# Patient Record
Sex: Male | Born: 1950 | Race: White | Hispanic: No | Marital: Married | Smoking: Former smoker
Health system: Southern US, Community
[De-identification: ages and names within clinical notes are randomized; demographics above are authoritative.]

## PROBLEM LIST (undated history)

## (undated) DIAGNOSIS — E7800 Pure hypercholesterolemia, unspecified: Secondary | ICD-10-CM

## (undated) DIAGNOSIS — H6690 Otitis media, unspecified, unspecified ear: Secondary | ICD-10-CM

## (undated) DIAGNOSIS — K219 Gastro-esophageal reflux disease without esophagitis: Secondary | ICD-10-CM

## (undated) DIAGNOSIS — C44301 Unspecified malignant neoplasm of skin of nose: Secondary | ICD-10-CM

## (undated) DIAGNOSIS — M5416 Radiculopathy, lumbar region: Secondary | ICD-10-CM

## (undated) DIAGNOSIS — R51 Headache: Secondary | ICD-10-CM

## (undated) DIAGNOSIS — J069 Acute upper respiratory infection, unspecified: Secondary | ICD-10-CM

## (undated) DIAGNOSIS — G8929 Other chronic pain: Secondary | ICD-10-CM

## (undated) DIAGNOSIS — Z87442 Personal history of urinary calculi: Secondary | ICD-10-CM

## (undated) HISTORY — DX: Radiculopathy, lumbar region: M54.16

## (undated) HISTORY — DX: Unspecified malignant neoplasm of skin of nose: C44.301

## (undated) HISTORY — DX: Gastro-esophageal reflux disease without esophagitis: K21.9

## (undated) HISTORY — DX: Otitis media, unspecified, unspecified ear: H66.90

## (undated) HISTORY — DX: Pure hypercholesterolemia, unspecified: E78.00

## (undated) HISTORY — DX: Headache: R51

## (undated) HISTORY — PX: VASECTOMY: SHX75

## (undated) HISTORY — DX: Acute upper respiratory infection, unspecified: J06.9

## (undated) HISTORY — PX: BACK SURGERY: SHX140

## (undated) HISTORY — PX: SKIN CANCER EXCISION: SHX779

## (undated) HISTORY — DX: Other chronic pain: G89.29

---

## 2006-10-07 ENCOUNTER — Emergency Department: Payer: Self-pay | Admitting: Emergency Medicine

## 2007-01-31 ENCOUNTER — Encounter: Admission: RE | Admit: 2007-01-31 | Discharge: 2007-01-31 | Payer: Self-pay | Admitting: Neurosurgery

## 2008-06-18 ENCOUNTER — Encounter: Admission: RE | Admit: 2008-06-18 | Discharge: 2008-06-18 | Payer: Self-pay | Admitting: Neurosurgery

## 2008-07-02 ENCOUNTER — Encounter: Admission: RE | Admit: 2008-07-02 | Discharge: 2008-07-02 | Payer: Self-pay | Admitting: Neurosurgery

## 2008-07-16 ENCOUNTER — Encounter: Admission: RE | Admit: 2008-07-16 | Discharge: 2008-07-16 | Payer: Self-pay | Admitting: Neurosurgery

## 2008-08-06 ENCOUNTER — Encounter: Admission: RE | Admit: 2008-08-06 | Discharge: 2008-08-06 | Payer: Self-pay | Admitting: Neurosurgery

## 2008-08-13 ENCOUNTER — Ambulatory Visit (HOSPITAL_COMMUNITY): Admission: RE | Admit: 2008-08-13 | Discharge: 2008-08-14 | Payer: Self-pay | Admitting: Neurosurgery

## 2009-02-18 ENCOUNTER — Encounter: Admission: RE | Admit: 2009-02-18 | Discharge: 2009-02-18 | Payer: Self-pay | Admitting: Neurosurgery

## 2009-08-11 ENCOUNTER — Ambulatory Visit (HOSPITAL_COMMUNITY): Admission: RE | Admit: 2009-08-11 | Discharge: 2009-08-12 | Payer: Self-pay | Admitting: Neurosurgery

## 2009-09-12 IMAGING — CR DG LUMBAR SPINE 2-3V
1 series · 1 of 1 positions shown · non-contrast
Comparison: 01/31/2007

CLINICAL DATA: L2-3 and L3-4 laminectomy.

LUMBAR SPINE - 2-3 VIEW

[view not recorded]
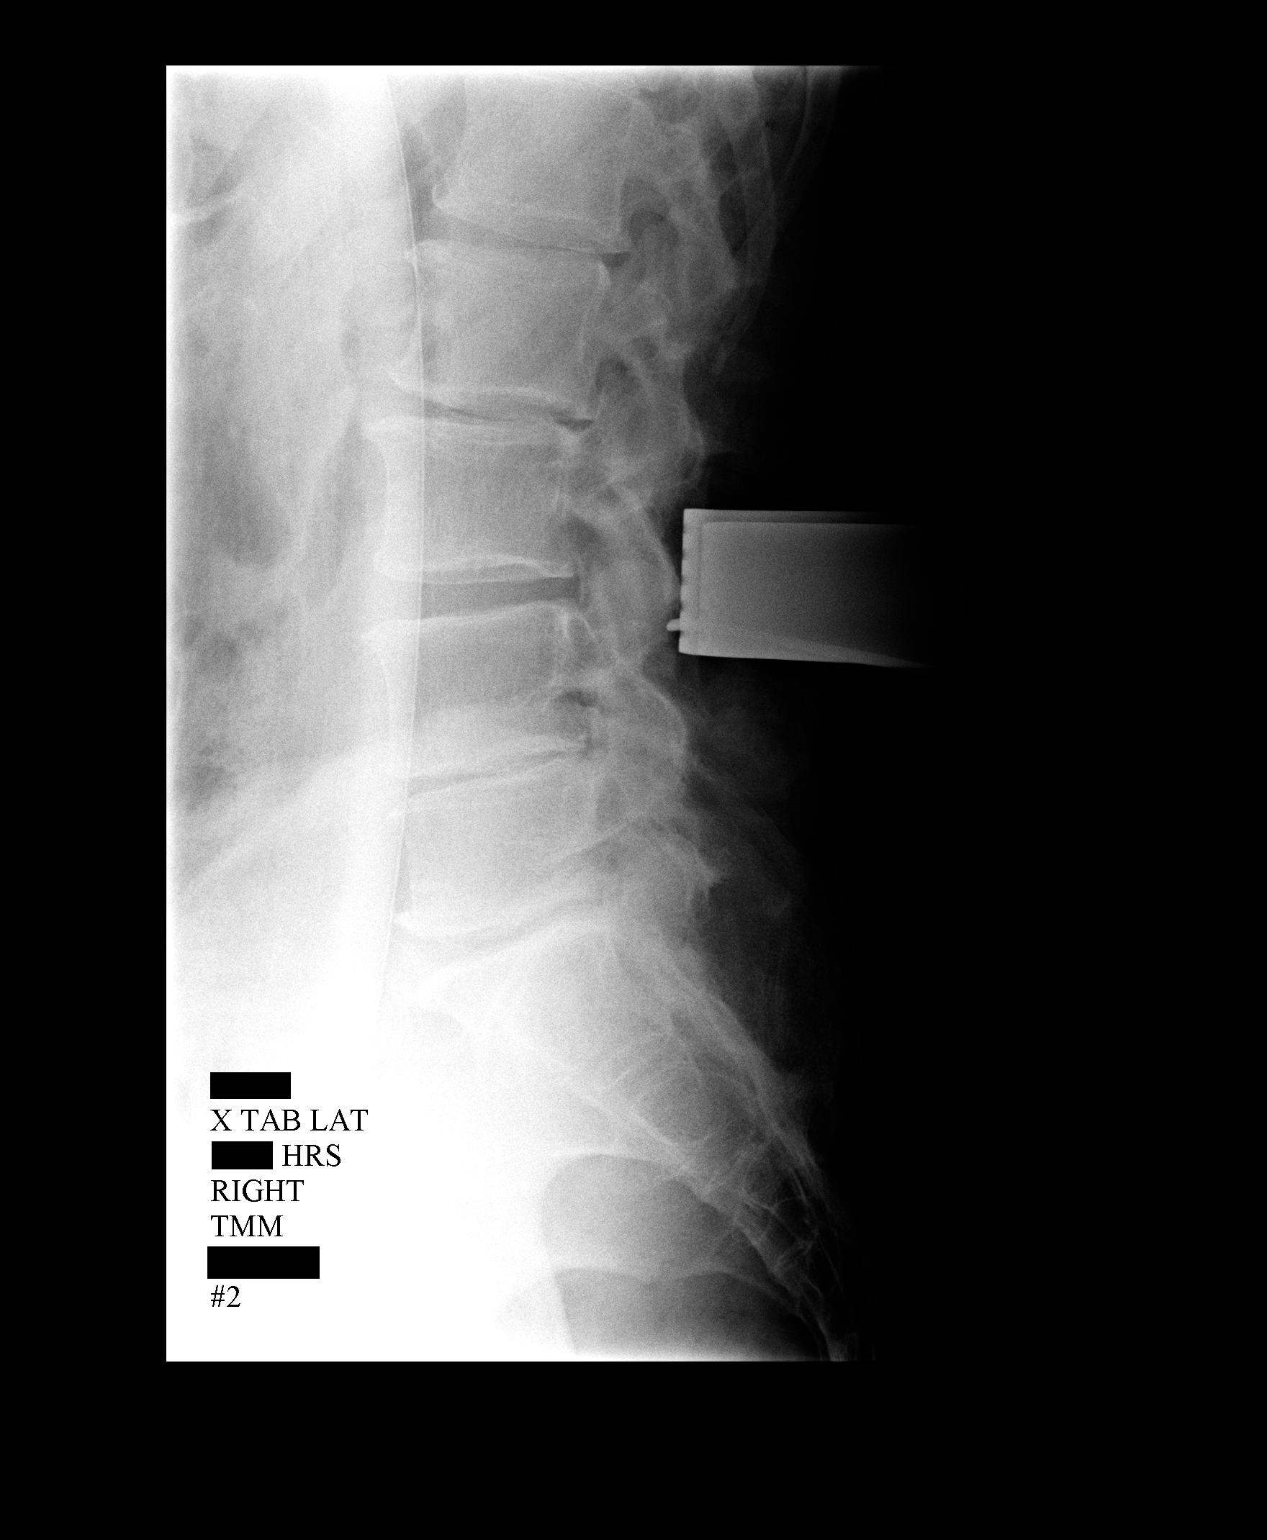

[1 of 1 positions shown; findings below may reference images not displayed]

FINDINGS: Intraoperative lateral views of the lumbar spine
submitted for review at surgery.  The first film reveals metallic
probe at the L4 level.  The second film reveals metallic probe
directed to the L3-4 level.
IMPRESSION: Localization L3-4.

## 2010-09-10 IMAGING — CR DG LUMBAR SPINE 2-3V
1 series · 1 of 1 positions shown · non-contrast
Comparison: CT lumbar myelogram 02/18/2009.

CLINICAL DATA: 57-year-old male undergoing lumbar surgery.

LUMBAR SPINE - 2-3 VIEW

[view not recorded]
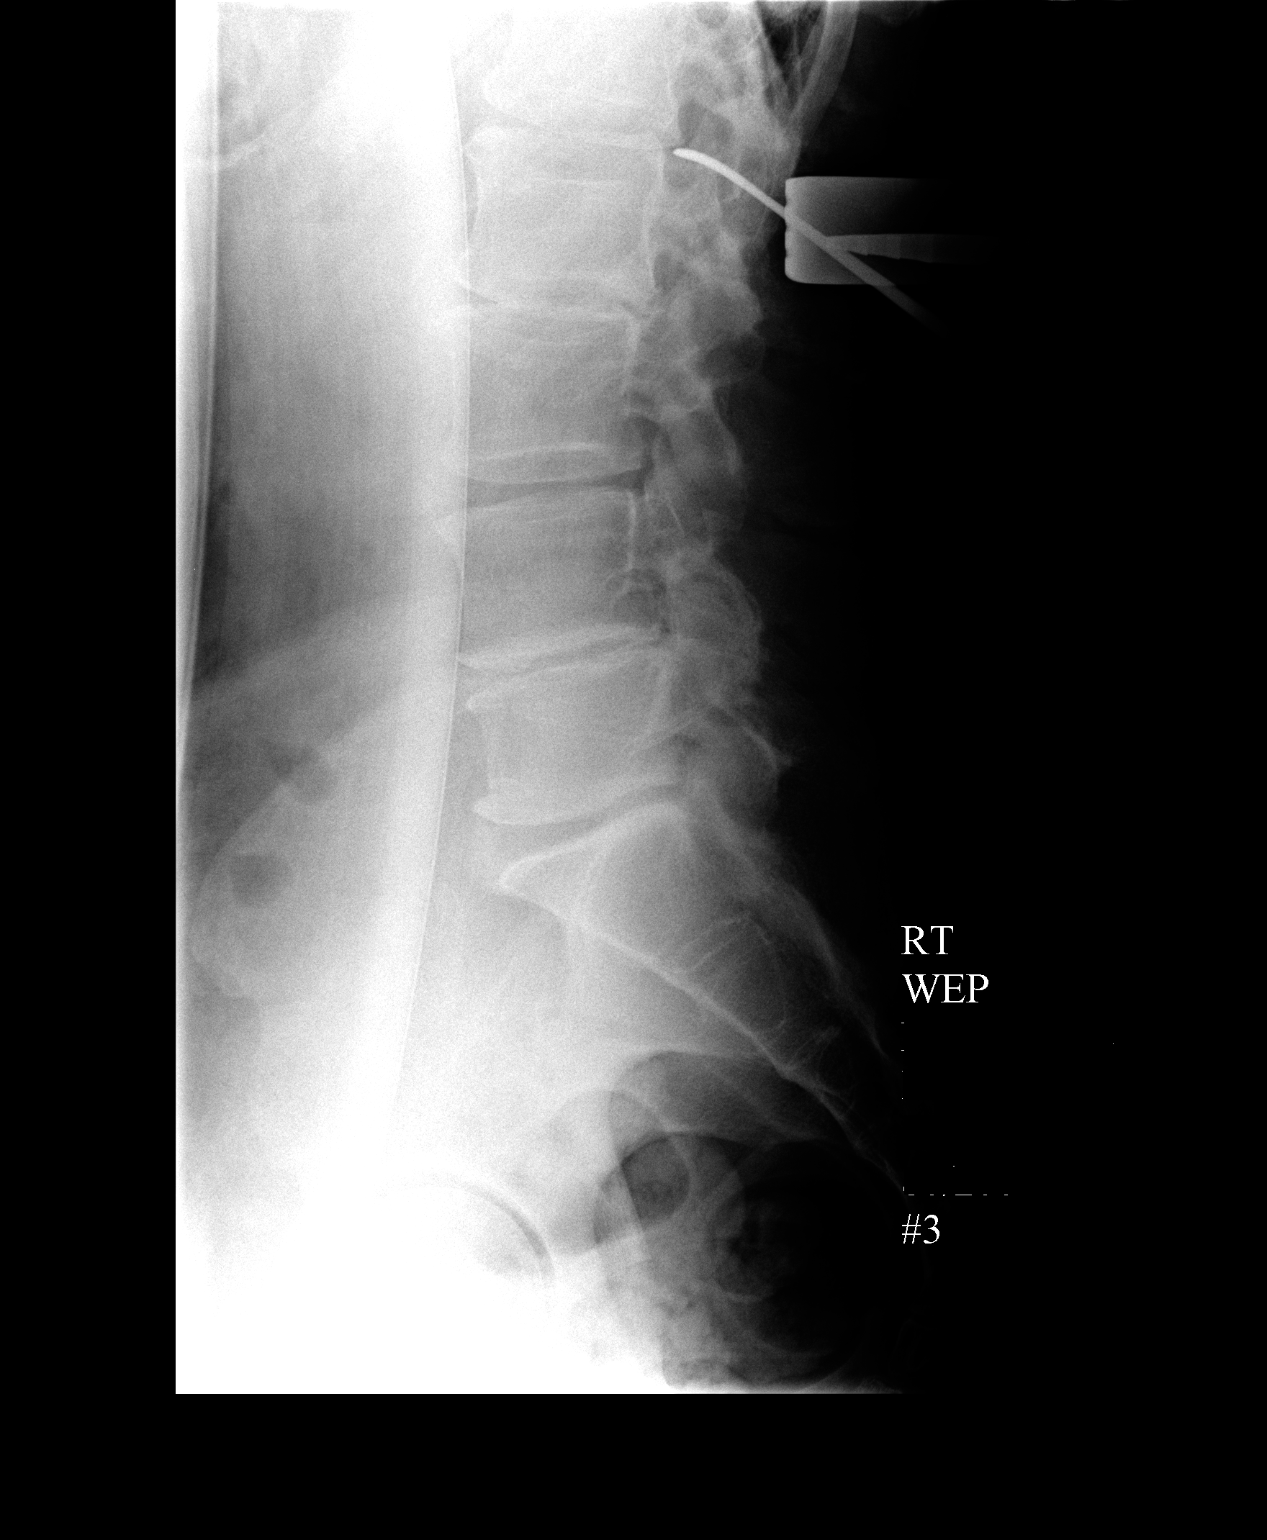

[1 of 1 positions shown; findings below may reference images not displayed]

FINDINGS: Multiple portable cross-table lateral intraoperative view
of the lumbar spine labeled

On image #1 at 5883 hours a needle is directed at the L2 vertebral
body, the level of the L2 narrowing the foramen.

On image #2 4169 hours there is a surgical probe directed at the L2
pedicle level.

On image #3 at 8596 hours surgical probe is directed at the L1-L2
disc space.
IMPRESSION: Intraoperative localization as above.

## 2010-10-22 ENCOUNTER — Encounter: Payer: Self-pay | Admitting: Neurosurgery

## 2011-01-03 LAB — CBC
Hemoglobin: 14.2 g/dL (ref 13.0–17.0)
MCHC: 35 g/dL (ref 30.0–36.0)
MCV: 93.7 fL (ref 78.0–100.0)
RBC: 4.34 MIL/uL (ref 4.22–5.81)
WBC: 6.1 10*3/uL (ref 4.0–10.5)

## 2011-02-13 NOTE — Op Note (Signed)
NAME:  Jacob [name], Jacob [name] NO.:  [id number]   MEDICAL RECORD NO.:  [id number]          PATIENT TYPE:  INP   LOCATION:  2899                         FACILITY:  [facility]   PHYSICIAN:  Reinaldo Meeker, M.D. 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   PROCEDURE:  Bilateral L2-L3 and L3-L4 interlaminar laminotomies for  decompression of L3 and L4 nerve roots bilaterally.   SURGEON:  Reinaldo Meeker, MD   ASSISTANT:  Tia Alert, MD   PROCEDURE IN DETAIL:  After being placed in the prone position, the  patient's back was prepped and draped in the usual sterile fashion.  Localizing x-rays were taken prior to incision to identify the  appropriate level.  Midline incision was made about the spinous  processes of L2, L3, and L4.  Using the Bovie and cutting current, the  incision was carried down to the spinous processes.  Subperiosteal  dissection was then carried out bilaterally on the spinous processes and  lamina and facet joints.  A self-retaining retractor was placed for  exposure.  X-rays showed approach to be at the appropriate levels.  Starting at L3-L4 on the left, laminotomy was performed by removing the  inferior one-half of the L3 lamina, the medial one-third of the facet  joint, and superior one-third of the L4 lamina.  Residual bone and  ligamentum flavum were removed in a piecemeal fashion.  L4 nerve root  was identified, tracked out its foramen, and decompressed thoroughly.  Attention was then turned to L2-L3 where a similar procedure was carried  out.  Once again, the inferior one-half of L2 lamina, medial one-third  of facet joint, and the superior one-third of L3 lamina were  removed.  Once again, residual bone and ligamentum flavum were removed in  piecemeal fashion until thecal sac in L3 nerve root was identified and  well decompressed at its foramen.  Disk at L2-L3 and L3-L4 on the left  was then examined and not felt to be herniated.  At this time, similar  laminotomies were performed on the left.  Starting at the L2-L3, the  inferior one-half of the L2 lamina, the medial one-third of the facet  joint, and the superior one-third of the L3 lamina were removed.  Once  again, residual bone and ligamentum flavum were removed in a piecemeal  fashion.  At L3-L4, the superior one-half of L3 lamina, the medial one-  third of facet joint, and superior one-third of the L4 lamina were  removed.  Once again, residual bone and ligamentum flavum were removed  in a piecemeal fashion until the L4 nerve root was identified and well  decompressed at its foramen.  At this time, disk spaces at L2-L3 and L3-  L4 on the right were identified and felt not to be herniated and not in  need  of any incision or removal.  At this time, the wound was inspected  once more for any evidence of residual compression and none could be  identified.  Thecal sac was well decompressed bilaterally as were the L3  and L4 nerve roots.  Large amounts of irrigation were carried at this  time and any bleeding was controlled bipolar coagulation and Gelfoam.  The wound  was then closed with interrupted Vicryl in the muscle fascia,  subcutaneous and subcuticular tissues, and staples were placed on the  skin.  Sterile dressing was then applied and the patient was extubated  and taken to the recovery room in stable condition.           ______________________________  Reinaldo Meeker, M.D.     ROK/MEDQ  D:  08/13/2008  T:  08/14/2008  Job:  [id number]

## 2011-07-03 LAB — CBC
HCT: 45.8
Hemoglobin: 15.4
MCV: 94.7
RBC: 4.83
RDW: 13.9

## 2013-05-13 ENCOUNTER — Encounter (HOSPITAL_COMMUNITY): Payer: Self-pay | Admitting: Pharmacy Technician

## 2013-05-13 NOTE — Consult Note (Signed)
NAMELITTLETON, HAUB NO.:  [id number]  MEDICAL RECORD NO.:  [id number]  LOCATION:                                 FACILITY:  PHYSICIAN:  Barbaraann Barthel, M.D. DATE OF BIRTH:  13-Oct-1950  DATE OF CONSULTATION:  05/12/2013 DATE OF DISCHARGE:                                CONSULTATION   DIAGNOSIS:  Right inguinal hernia.  NOTE:  This is a 62 year old white male who we saw earlier in July after referral for an elective repair of a right inguinal hernia.  His family was just about to experience the birth of a grandchild and so surgery was postponed as this was an elective case.  We then saw him in the office on May 12, 2013, at which time we planned to proceed with the surgery.  HISTORY OF PRESENT MEDICAL ILLNESS:  The patient has had the right inguinal hernia at least for 2 or 3 months.  PAST MEDICAL HISTORY:  The patient has had mainly chronic back pain which has required 3 previous surgeries, the last surgery being in 2011. This was complicated with a right footdrop.  The other medical problems include besides his lumbar radiculopathy from his back pain, he has problems with GERD, history of hypercholesterolemia, and he has had a previous surgical excision of a skin cancer on the tip of his nose.  PAST SURGICAL HISTORY:  Three back surgeries, skin cancer surgery on the nose.  MEDICATIONS:  See medication list.  In essence, he is taking right now simply codeine medications for his chronic back pain and he has now been started on a cholesterol medicine.  ALLERGIES:  He has no known allergies.  SOCIAL HISTORY:  He no longer smokes.  He used to smoke, but he has not smoked essentially since 1985.  He does not abuse tobacco or recreational drugs.  PHYSICAL EXAMINATION:  GENERAL:  He is in no acute distress.  He is 5 feet 10 inches tall.  Weighs 185 pounds. VITAL SIGNS:  Temperature is 98.0, pulse is regular, 56 per minute, respirations 14, blood  pressure 110/64. HEENT:  Head is normocephalic.  Eyes, extraocular movements are intact. The patient has some problem with the loss of vision in his right eye. As stated, he has a skin graft that is well healed for cancer of the skin with no obvious recurrence noted. NECK:  He has no bruits appreciated.  No adenopathy.  No thyromegaly. CHEST:  Clear both to anterior and posterior auscultation. HEART:  Regular rhythm. ABDOMEN:  Soft.  The patient has a right inguinal hernia which is easily reducible.  This is moderate in size.  There is no visceromegaly or masses palpated. RECTAL:  By the patient's request, deferred. EXTREMITIES:  He has a right footdrop.  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  REVIEW OF SYSTEMS:  NEUROLOGIC:  No history of migraines, no history of seizures.  He has a footdrop from previous back surgeries as a sequela. ENDOCRINE:  No history of diabetes or thyroid disease or adrenal problems.  CARDIOPULMONARY:  Hypercholesterolemia.  MUSCULOSKELETAL:  As stated, back problems and radiculopathy with right  footdrop.  GI:  No history of hepatitis, constipation, diarrhea, bright red rectal bleeding, melena, inflammatory bowel disease, irritable bowel syndrome, or history of unexplained weight loss.  He underwent a colonoscopy with polypectomy in 2010.  GU:  No history of frequency or dysuria.  He has a past history of kidney stones.  REVIEW OF HISTORY OF PHYSICAL:  Therefore, Mr. Olgin is a 62 year old white male who we will plan to do an elective right inguinal herniorrhaphy.  I discussed complications with him.  Complications not limited to, but including bleeding, infection, and recurrence, and the possibility of mesh prosthesis might be required.  Informed consent was obtained.     Barbaraann Barthel, M.D.     WB/MEDQ  D:  05/12/2013  T:  05/13/2013  Job:  [id number]  cc:   Theressa Millard, M.D. Fax: [phone]

## 2013-05-14 ENCOUNTER — Encounter (HOSPITAL_COMMUNITY)
Admission: RE | Admit: 2013-05-14 | Discharge: 2013-05-14 | Disposition: A | Discharge status: Home / Self Care | Payer: BC Managed Care – PPO | Source: Ambulatory Visit | Attending: General Surgery | Admitting: General Surgery

## 2013-05-14 ENCOUNTER — Encounter (HOSPITAL_COMMUNITY): Payer: Self-pay

## 2013-05-14 ENCOUNTER — Other Ambulatory Visit: Payer: Self-pay

## 2013-05-14 HISTORY — DX: Pure hypercholesterolemia, unspecified: E78.00

## 2013-05-14 LAB — CBC WITH DIFFERENTIAL/PLATELET
Basophils Absolute: 0.1 10*3/uL (ref 0.0–0.1)
Lymphocytes Relative: 31 % (ref 12–46)
MCV: 91.5 fL (ref 78.0–100.0)
Monocytes Absolute: 0.6 10*3/uL (ref 0.1–1.0)
Neutrophils Relative %: 46 % (ref 43–77)
Platelets: 210 10*3/uL (ref 150–400)
RBC: 4.26 MIL/uL (ref 4.22–5.81)
RDW: 12.9 % (ref 11.5–15.5)

## 2013-05-14 LAB — BASIC METABOLIC PANEL
Calcium: 9.5 mg/dL (ref 8.4–10.5)
Chloride: 102 mEq/L (ref 96–112)
Creatinine, Ser: 0.96 mg/dL (ref 0.50–1.35)
GFR calc non Af Amer: 89 mL/min — ABNORMAL LOW (ref >90)
Sodium: 140 mEq/L (ref 135–145)

## 2013-05-14 NOTE — Patient Instructions (Addendum)
Jacob [name]  05/14/2013   Your procedure is scheduled on:  05/18/2013  Report to [facility] at  1000  AM.  Call this number if you have problems the morning of surgery: [phone]   Remember:   Do not eat food or drink liquids after midnight.   Take these medicines the morning of surgery with A SIP OF WATER: pain pill   Do not wear jewelry, make-up or nail polish.  Do not wear lotions, powders, or perfumes.   Do not shave 48 hours prior to surgery. Men may shave face and neck.  Do not bring valuables to the hospital.  [facility] is not responsible  for any belongings or valuables.  Contacts, dentures or bridgework may not be worn into surgery.  Leave suitcase in the car. After surgery it may be brought to your room.  For patients admitted to the hospital, checkout time is 11:00 AM the day of discharge.   Patients discharged the day of surgery will not be allowed to drive home.  Name and phone number of your driver: family  Special Instructions: Shower using CHG 2 nights before surgery and the night before surgery.  If you shower the day of surgery use CHG.  Use special wash - you have one bottle of CHG for all showers.  You should use approximately 1/3 of the bottle for each shower.   Please read over the following fact sheets that you were given: Pain Booklet, Coughing and Deep Breathing, MRSA Information, Surgical Site Infection Prevention, Anesthesia Post-op Instructions and Care and Recovery After Surgery Inguinal Hernia, Adult Muscles help keep everything in the body in its proper place. But if a weak spot in the muscles develops, something can poke through. That is called a hernia. When this happens in the lower part of the belly (abdomen), it is called an inguinal hernia. (It takes its name from a part of the body in this region called the inguinal canal.) A weak spot in the wall of muscles lets some fat or part of the small intestine bulge through. An inguinal hernia can  develop at any age. Men get them more often than women. CAUSES  In adults, an inguinal hernia develops over time.  It can be triggered by:  Suddenly straining the muscles of the lower abdomen.  Lifting heavy objects.  Straining to have a bowel movement. Difficult bowel movements (constipation) can lead to this.  Constant coughing. This may be caused by smoking or lung disease.  Being overweight.  Being pregnant.  Working at a job that requires long periods of standing or heavy lifting.  Having had an inguinal hernia before. One type can be an emergency situation. It is called a strangulated inguinal hernia. It develops if part of the small intestine slips through the weak spot and cannot get back into the abdomen. The blood supply can be cut off. If that happens, part of the intestine may die. This situation requires emergency surgery. SYMPTOMS  Often, a small inguinal hernia has no symptoms. It is found when a healthcare provider does a physical exam. Larger hernias usually have symptoms.   In adults, symptoms may include:  A lump in the groin. This is easier to see when the person is standing. It might disappear when lying down.  In men, a lump in the scrotum.  Pain or burning in the groin. This occurs especially when lifting, straining or coughing.  A dull ache or feeling of  pressure in the groin.  Signs of a strangulated hernia can include:  A bulge in the groin that becomes very painful and tender to the touch.  A bulge that turns red or purple.  Fever, nausea and vomiting.  Inability to have a bowel movement or to pass gas. DIAGNOSIS  To decide if you have an inguinal hernia, a healthcare provider will probably do a physical examination.  This will include asking questions about any symptoms you have noticed.  The healthcare provider might feel the groin area and ask you to cough. If an inguinal hernia is felt, the healthcare provider may try to slide it back  into the abdomen.  Usually no other tests are needed. TREATMENT  Treatments can vary. The size of the hernia makes a difference. Options include:  Watchful waiting. This is often suggested if the hernia is small and you have had no symptoms.  No medical procedure will be done unless symptoms develop.  You will need to watch closely for symptoms. If any occur, contact your healthcare provider right away.  Surgery. This is used if the hernia is larger or you have symptoms.  Open surgery. This is usually an outpatient procedure (you will not stay overnight in a hospital). An cut (incision) is made through the skin in the groin. The hernia is put back inside the abdomen. The weak area in the muscles is then repaired by herniorrhaphy or hernioplasty. Herniorrhaphy: in this type of surgery, the weak muscles are sewn back together. Hernioplasty: a patch or mesh is used to close the weak area in the abdominal wall.  Laparoscopy. In this procedure, a surgeon makes small incisions. A thin tube with a tiny video camera (called a laparoscope) is put into the abdomen. The surgeon repairs the hernia with mesh by looking with the video camera and using two long instruments. HOME CARE INSTRUCTIONS   After surgery to repair an inguinal hernia:  You will need to take pain medicine prescribed by your healthcare provider. Follow all directions carefully.  You will need to take care of the wound from the incision.  Your activity will be restricted for awhile. This will probably include no heavy lifting for several weeks. You also should not do anything too active for a few weeks. When you can return to work will depend on the type of job that you have.  During "watchful waiting" periods, you should:  Maintain a healthy weight.  Eat a diet high in fiber (fruits, vegetables and whole grains).  Drink plenty of fluids to avoid constipation. This means drinking enough water and other liquids to keep your  urine clear or pale yellow.  Do not lift heavy objects.  Do not stand for long periods of time.  Quit smoking. This should keep you from developing a frequent cough. SEEK MEDICAL CARE IF:   A bulge develops in your groin area.  You feel pain, a burning sensation or pressure in the groin. This might be worse if you are lifting or straining.  You develop a fever of more than 100.5 F (38.1 C). SEEK IMMEDIATE MEDICAL CARE IF:   Pain in the groin increases suddenly.  A bulge in the groin gets bigger suddenly and does not go down.  For men, there is sudden pain in the scrotum. Or, the size of the scrotum increases.  A bulge in the groin area becomes red or purple and is painful to touch.  You have nausea or vomiting that does not go  away.  You feel your heart beating much faster than normal.  You cannot have a bowel movement or pass gas.  You develop a fever of more than 102.0 F (38.9 C). Document Released: 02/03/2009 Document Revised: 12/10/2011 Document Reviewed: 02/03/2009 [facility] Patient Information 2014 [place], [place]. PATIENT INSTRUCTIONS POST-ANESTHESIA  IMMEDIATELY FOLLOWING SURGERY:  Do not drive or operate machinery for the first twenty four hours after surgery.  Do not make any important decisions for twenty four hours after surgery or while taking narcotic pain medications or sedatives.  If you develop intractable nausea and vomiting or a severe headache please notify your doctor immediately.  FOLLOW-UP:  Please make an appointment with your surgeon as instructed. You do not need to follow up with anesthesia unless specifically instructed to do so.  WOUND CARE INSTRUCTIONS (if applicable):  Keep a dry clean dressing on the anesthesia/puncture wound site if there is drainage.  Once the wound has quit draining you may leave it open to air.  Generally you should leave the bandage intact for twenty four hours unless there is drainage.  If the epidural site drains for  more than 36-48 hours please call the anesthesia department.  QUESTIONS?:  Please feel free to call your physician or the hospital operator if you have any questions, and they will be happy to assist you.

## 2013-05-18 ENCOUNTER — Encounter (HOSPITAL_COMMUNITY)
Admission: RE | Disposition: A | Discharge status: Home / Self Care | Payer: Self-pay | Source: Ambulatory Visit | Attending: General Surgery

## 2013-05-18 ENCOUNTER — Encounter (HOSPITAL_COMMUNITY): Payer: Self-pay

## 2013-05-18 ENCOUNTER — Ambulatory Visit (HOSPITAL_COMMUNITY)
Admission: RE | Admit: 2013-05-18 | Discharge: 2013-05-18 | Disposition: A | Discharge status: Home / Self Care | Payer: BC Managed Care – PPO | Source: Ambulatory Visit | Attending: General Surgery | Admitting: General Surgery

## 2013-05-18 ENCOUNTER — Encounter (HOSPITAL_COMMUNITY): Payer: Self-pay | Admitting: Anesthesiology

## 2013-05-18 ENCOUNTER — Ambulatory Visit (HOSPITAL_COMMUNITY): Payer: BC Managed Care – PPO | Admitting: Anesthesiology

## 2013-05-18 DIAGNOSIS — K4090 Unilateral inguinal hernia, without obstruction or gangrene, not specified as recurrent: Secondary | ICD-10-CM | POA: Insufficient documentation

## 2013-05-18 DIAGNOSIS — Z01812 Encounter for preprocedural laboratory examination: Secondary | ICD-10-CM | POA: Insufficient documentation

## 2013-05-18 HISTORY — PX: INGUINAL HERNIA REPAIR: SHX194

## 2013-05-18 SURGERY — REPAIR, HERNIA, INGUINAL, ADULT
Anesthesia: General | Site: Abdomen | Laterality: Right | Wound class: Clean

## 2013-05-18 MED ORDER — DOCUSATE SODIUM 100 MG PO CAPS
100.0000 mg | ORAL_CAPSULE | Freq: Every day | ORAL | Status: DC
  Administered 2013-05-18: 100 mg via ORAL
  Filled 2013-05-18: qty 1

## 2013-05-18 MED ORDER — SODIUM CHLORIDE 0.9 % IR SOLN
Status: DC | PRN
  Administered 2013-05-18: 1000 mL

## 2013-05-18 MED ORDER — ONDANSETRON HCL 4 MG/2ML IJ SOLN
4.0000 mg | Freq: Once | INTRAMUSCULAR | Status: AC
  Administered 2013-05-18: 4 mg via INTRAVENOUS

## 2013-05-18 MED ORDER — ONDANSETRON HCL 4 MG PO TABS: 4.0000 mg | ORAL_TABLET | Freq: Four times a day (QID) | ORAL | Status: DC | PRN

## 2013-05-18 MED ORDER — FENTANYL CITRATE 0.05 MG/ML IJ SOLN
INTRAMUSCULAR | Status: AC
  Filled 2013-05-18: qty 5

## 2013-05-18 MED ORDER — PROPOFOL 10 MG/ML IV BOLUS
INTRAVENOUS | Status: DC | PRN
  Administered 2013-05-18: 160 mg via INTRAVENOUS

## 2013-05-18 MED ORDER — ONDANSETRON HCL 4 MG/2ML IJ SOLN: 4.0000 mg | Freq: Once | INTRAMUSCULAR | Status: DC | PRN

## 2013-05-18 MED ORDER — CEFAZOLIN SODIUM-DEXTROSE 2-3 GM-% IV SOLR
2.0000 g | Freq: Once | INTRAVENOUS | Status: AC
  Administered 2013-05-18: 2 g via INTRAVENOUS

## 2013-05-18 MED ORDER — CEFAZOLIN SODIUM 1 G IJ SOLR
INTRAMUSCULAR | Status: AC
  Filled 2013-05-18: qty 20

## 2013-05-18 MED ORDER — OXYCODONE HCL ER 20 MG PO T12A: 20.0000 mg | EXTENDED_RELEASE_TABLET | Freq: Two times a day (BID) | ORAL | Status: DC

## 2013-05-18 MED ORDER — POTASSIUM CHLORIDE IN NACL 20-0.9 MEQ/L-% IV SOLN
INTRAVENOUS | Status: DC
  Administered 2013-05-18: 15:00:00 via INTRAVENOUS

## 2013-05-18 MED ORDER — FENTANYL CITRATE 0.05 MG/ML IJ SOLN
INTRAMUSCULAR | Status: AC
  Filled 2013-05-18: qty 2

## 2013-05-18 MED ORDER — FENTANYL CITRATE 0.05 MG/ML IJ SOLN
25.0000 ug | INTRAMUSCULAR | Status: DC | PRN
  Administered 2013-05-18: 50 ug via INTRAVENOUS

## 2013-05-18 MED ORDER — STERILE WATER FOR IRRIGATION IR SOLN
Status: DC | PRN
  Administered 2013-05-18 (×2): 1000 mL

## 2013-05-18 MED ORDER — FENTANYL CITRATE 0.05 MG/ML IJ SOLN
INTRAMUSCULAR | Status: DC | PRN
  Administered 2013-05-18 (×5): 25 ug via INTRAVENOUS
  Administered 2013-05-18 (×2): 50 ug via INTRAVENOUS

## 2013-05-18 MED ORDER — FENTANYL CITRATE 0.05 MG/ML IJ SOLN: 25.0000 ug | INTRAMUSCULAR | Status: DC | PRN

## 2013-05-18 MED ORDER — MIDAZOLAM HCL 2 MG/2ML IJ SOLN
INTRAMUSCULAR | Status: AC
  Filled 2013-05-18: qty 2

## 2013-05-18 MED ORDER — BUPIVACAINE HCL (PF) 0.5 % IJ SOLN
INTRAMUSCULAR | Status: DC | PRN
  Administered 2013-05-18: 5 mL
  Administered 2013-05-18: 8 mL

## 2013-05-18 MED ORDER — ATORVASTATIN CALCIUM 20 MG PO TABS: 40.0000 mg | ORAL_TABLET | Freq: Every day | ORAL | Status: DC

## 2013-05-18 MED ORDER — ONDANSETRON HCL 4 MG/2ML IJ SOLN
INTRAMUSCULAR | Status: AC
  Filled 2013-05-18: qty 2

## 2013-05-18 MED ORDER — LACTATED RINGERS IV SOLN
INTRAVENOUS | Status: DC
  Administered 2013-05-18 (×2): via INTRAVENOUS

## 2013-05-18 MED ORDER — FENTANYL CITRATE 0.05 MG/ML IJ SOLN
25.0000 ug | INTRAMUSCULAR | Status: AC
  Administered 2013-05-18 (×2): 25 ug via INTRAVENOUS

## 2013-05-18 MED ORDER — DSS 100 MG PO CAPS: 100.0000 mg | ORAL_CAPSULE | Freq: Every day | ORAL | Status: DC

## 2013-05-18 MED ORDER — PNEUMOCOCCAL VAC POLYVALENT 25 MCG/0.5ML IJ INJ
0.5000 mL | INJECTION | INTRAMUSCULAR | Status: DC
  Filled 2013-05-18: qty 0.5

## 2013-05-18 MED ORDER — MIDAZOLAM HCL 2 MG/2ML IJ SOLN
1.0000 mg | INTRAMUSCULAR | Status: DC | PRN
  Administered 2013-05-18 (×2): 2 mg via INTRAVENOUS

## 2013-05-18 MED ORDER — BUPIVACAINE HCL (PF) 0.5 % IJ SOLN
INTRAMUSCULAR | Status: AC
  Filled 2013-05-18: qty 30

## 2013-05-18 MED ORDER — CEFAZOLIN SODIUM-DEXTROSE 2-3 GM-% IV SOLR
INTRAVENOUS | Status: AC
  Filled 2013-05-18: qty 50

## 2013-05-18 MED ORDER — LIDOCAINE HCL (CARDIAC) 20 MG/ML IV SOLN
INTRAVENOUS | Status: DC | PRN
  Administered 2013-05-18: 30 mg via INTRAVENOUS

## 2013-05-18 MED ORDER — PROPOFOL 10 MG/ML IV EMUL
INTRAVENOUS | Status: AC
  Filled 2013-05-18: qty 20

## 2013-05-18 MED ORDER — MORPHINE SULFATE 2 MG/ML IJ SOLN
1.0000 mg | INTRAMUSCULAR | Status: DC | PRN
  Administered 2013-05-18: 1 mg via INTRAVENOUS
  Filled 2013-05-18: qty 1

## 2013-05-18 MED ORDER — ONDANSETRON HCL 4 MG/2ML IJ SOLN: 4.0000 mg | Freq: Four times a day (QID) | INTRAMUSCULAR | Status: DC | PRN

## 2013-05-18 MED ORDER — LIDOCAINE HCL (PF) 1 % IJ SOLN
INTRAMUSCULAR | Status: AC
  Filled 2013-05-18: qty 5

## 2013-05-18 SURGICAL SUPPLY — 48 items
ATTRACTOMAT 16X20 MAGNETIC DRP (DRAPES) ×2 IMPLANT
BAG HAMPER (MISCELLANEOUS) ×2 IMPLANT
CLOTH BEACON ORANGE TIMEOUT ST (SAFETY) ×2 IMPLANT
COVER LIGHT HANDLE STERIS (MISCELLANEOUS) ×4 IMPLANT
DECANTER SPIKE VIAL GLASS SM (MISCELLANEOUS) ×2 IMPLANT
DRAIN PENROSE 12X.25 LTX STRL (MISCELLANEOUS) ×2 IMPLANT
DRSG MEPILEX BORDER 4X8 (GAUZE/BANDAGES/DRESSINGS) IMPLANT
DRSG TEGADERM 4X4.75 (GAUZE/BANDAGES/DRESSINGS) ×2 IMPLANT
ELECT REM PT RETURN 9FT ADLT (ELECTROSURGICAL) ×2
ELECTRODE REM PT RTRN 9FT ADLT (ELECTROSURGICAL) ×1 IMPLANT
FORMALIN 10 PREFIL 120ML (MISCELLANEOUS) ×2 IMPLANT
GLOVE BIOGEL PI IND STRL 6.5 (GLOVE) IMPLANT
GLOVE BIOGEL PI IND STRL 7.0 (GLOVE) IMPLANT
GLOVE BIOGEL PI INDICATOR 6.5 (GLOVE) ×1
GLOVE BIOGEL PI INDICATOR 7.0 (GLOVE) ×2
GLOVE ECLIPSE 6.5 STRL STRAW (GLOVE) ×1 IMPLANT
GLOVE EXAM NITRILE MD LF STRL (GLOVE) ×1 IMPLANT
GLOVE SKINSENSE NS SZ7.0 (GLOVE) ×1
GLOVE SKINSENSE STRL SZ7.0 (GLOVE) ×1 IMPLANT
GLOVE SS BIOGEL STRL SZ 6.5 (GLOVE) IMPLANT
GLOVE SUPERSENSE BIOGEL SZ 6.5 (GLOVE) ×1
GOWN STRL REIN XL XLG (GOWN DISPOSABLE) ×6 IMPLANT
INST SET MINOR GENERAL (KITS) ×2 IMPLANT
KIT ROOM TURNOVER APOR (KITS) ×2 IMPLANT
MANIFOLD NEPTUNE II (INSTRUMENTS) ×2 IMPLANT
NDL HYPO 25X1 1.5 SAFETY (NEEDLE) ×1 IMPLANT
NEEDLE HYPO 25X1 1.5 SAFETY (NEEDLE) ×2 IMPLANT
NS IRRIG 1000ML POUR BTL (IV SOLUTION) ×2 IMPLANT
PACK MINOR (CUSTOM PROCEDURE TRAY) ×2 IMPLANT
PAD ARMBOARD 7.5X6 YLW CONV (MISCELLANEOUS) ×2 IMPLANT
SET BASIN LINEN APH (SET/KITS/TRAYS/PACK) ×2 IMPLANT
SOL PREP PROV IODINE SCRUB 4OZ (MISCELLANEOUS) ×2 IMPLANT
SPONGE GAUZE 4X4 12PLY (GAUZE/BANDAGES/DRESSINGS) ×2 IMPLANT
SPONGE INTESTINAL PEANUT (DISPOSABLE) ×2 IMPLANT
SPONGE LAP 18X18 X RAY DECT (DISPOSABLE) ×2 IMPLANT
STAPLER VISISTAT 35W (STAPLE) ×2 IMPLANT
SUT NOVA NAB GS-22 2 2-0 T-19 (SUTURE) IMPLANT
SUT NUROLON NAB CT 2 2-0 18IN (SUTURE) IMPLANT
SUT PROLENE 0 CT 1 CR/8 (SUTURE) IMPLANT
SUT SILK 2 0 (SUTURE) ×2
SUT SILK 2-0 18XBRD TIE 12 (SUTURE) ×1 IMPLANT
SUT VIC AB 3-0 SH 27 (SUTURE) ×2
SUT VIC AB 3-0 SH 27X BRD (SUTURE) ×1 IMPLANT
SUT VICRYL AB 3 0 TIES (SUTURE) ×2 IMPLANT
SYR BULB IRRIGATION 50ML (SYRINGE) ×2 IMPLANT
SYR CONTROL 10ML LL (SYRINGE) ×2 IMPLANT
TRAY FOLEY CATH 16FR SILVER (SET/KITS/TRAYS/PACK) ×2 IMPLANT
WATER STERILE IRR 1000ML POUR (IV SOLUTION) ×4 IMPLANT

## 2013-05-18 NOTE — Addendum Note (Signed)
Addendum created 05/18/13 1207 by Moshe Salisbury, CRNA   Modules edited: Anesthesia Medication Administration

## 2013-05-18 NOTE — Progress Notes (Addendum)
Patient refused pneumonia vaccination.  D/c instructions reviewed with patient and wife.  Verbalized understanding.  Pt dcd to home with wife. Schonewitz, Candelaria Stagers 05/18/2013

## 2013-05-18 NOTE — Anesthesia Procedure Notes (Signed)
Procedure Name: LMA Insertion Date/Time: 05/18/2013 11:41 AM Performed by: Glynn Octave E Pre-anesthesia Checklist: Patient identified, Patient being monitored, Emergency Drugs available, Timeout performed and Suction available Patient Re-evaluated:Patient Re-evaluated prior to inductionOxygen Delivery Method: Circle System Utilized Preoxygenation: Pre-oxygenation with 100% oxygen Intubation Type: IV induction Ventilation: Mask ventilation without difficulty LMA: LMA inserted LMA Size: 4.0 Number of attempts: 1 Placement Confirmation: positive ETCO2 and breath sounds checked- equal and bilateral

## 2013-05-18 NOTE — Brief Op Note (Signed)
05/18/2013  11:59 AM  PATIENT:  Jacob [name]  62 y.o. male  PRE-OPERATIVE DIAGNOSIS:  right inguinal hernia  POST-OPERATIVE DIAGNOSIS:  right inguinal hernia  PROCEDURE:  Procedure(s): RIGHT INGUINAL HERNIA REPAIR ADULT (Right)  SURGEON:  Surgeon(s) and Role:    * Marlane Hatcher, MD - Primary  PHYSICIAN ASSISTANT:   ASSISTANTS: none   ANESTHESIA:   general  EBL:  Total I/O In: 1000 [I.V.:1000] Out: 300 [Urine:300]  BLOOD ADMINISTERED:none  DRAINS: none   LOCAL MEDICATIONS USED:  MARCAINE  0.5%  ~ 13 cc.   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  DISPOSITION OF SPECIMEN:  PATHOLOGY  COUNTS:  YES  TOURNIQUET:  * No tourniquets in log *  DICTATION: .Other Dictation: Dictation Number OR dict.# [id number].  PLAN OF CARE: Admit for overnight observation  PATIENT DISPOSITION:  PACU - hemodynamically stable.   Delay start of Pharmacological VTE agent (>24hrs) due to surgical blood loss or risk of bleeding: not applicable

## 2013-05-18 NOTE — Anesthesia Preprocedure Evaluation (Addendum)
Anesthesia Evaluation  Patient identified by MRN, date of birth, ID band Patient awake    Reviewed: Allergy & Precautions, H&P , NPO status , Patient's Chart, lab work & pertinent test results  History of Anesthesia Complications Negative for: history of anesthetic complications  Airway Mallampati: I TM Distance: >3 FB     Dental  (+) Teeth Intact   Pulmonary neg pulmonary ROS,  breath sounds clear to auscultation        Cardiovascular negative cardio ROS  Rhythm:Regular Rate:Normal     Neuro/Psych    GI/Hepatic negative GI ROS,   Endo/Other    Renal/GU      Musculoskeletal   Abdominal   Peds  Hematology   Anesthesia Other Findings   Reproductive/Obstetrics                           Anesthesia Physical Anesthesia Plan  ASA: I  Anesthesia Plan: General   Post-op Pain Management:    Induction: Intravenous  Airway Management Planned: LMA  Additional Equipment:   Intra-op Plan:   Post-operative Plan: Extubation in OR  Informed Consent: I have reviewed the patients History and Physical, chart, labs and discussed the procedure including the risks, benefits and alternatives for the proposed anesthesia with the patient or authorized representative who has indicated his/her understanding and acceptance.     Plan Discussed with:   Anesthesia Plan Comments:         Anesthesia Quick Evaluation  

## 2013-05-18 NOTE — Progress Notes (Signed)
Post OP Check.  Pt is doing very well.  Minimal surgical pain.  Wound clean and dry and voiding without foley and without dysuria.  Filed Vitals:   05/18/13 1601  BP: 90/46  Pulse: 65  Temp: 98.2 F (36.8 C)  Resp: 20    Discharge and follow up arranged.

## 2013-05-18 NOTE — Progress Notes (Addendum)
29 yr. Old W. Male for elective repair RIH.  Procedure and risks explained and consent obtained.  Labs reviewed.  Surgical site marked.  Filed Vitals:   05/18/13 0920  BP: 105/61  Pulse:   Temp:   Resp: 13  pulse 55 O2 sat 95 on room air Temp. 98.2 oral No clinical change in H&P, dict. # [id number].

## 2013-05-18 NOTE — Transfer of Care (Signed)
Immediate Anesthesia Transfer of Care Note  Patient: Jacob [name]  Procedure(s) Performed: Procedure(s): RIGHT INGUINAL HERNIA REPAIR ADULT (Right)  Patient Location: PACU  Anesthesia Type:General  Level of Consciousness: awake, alert  and oriented  Airway & Oxygen Therapy: Patient Spontanous Breathing and Patient connected to face mask oxygen  Post-op Assessment: Report given to PACU RN  Post vital signs: Reviewed  Complications: No apparent anesthesia complications

## 2013-05-18 NOTE — Anesthesia Postprocedure Evaluation (Signed)
  Anesthesia Post-op Note  Patient: Jacob [name]  Procedure(s) Performed: Procedure(s) with comments: RIGHT INGUINAL HERNIA REPAIR ADULT (Right) - (no mesh)  Patient Location: PACU  Anesthesia Type:General  Level of Consciousness: awake, alert  and oriented  Airway and Oxygen Therapy: Patient Spontanous Breathing and Patient connected to face mask oxygen  Post-op Pain: none  Post-op Assessment: Post-op Vital signs reviewed, Patient's Cardiovascular Status Stable, Respiratory Function Stable, Patent Airway and No signs of Nausea or vomiting  Post-op Vital Signs: Reviewed and stable  Complications: No apparent anesthesia complications

## 2013-05-19 ENCOUNTER — Encounter (HOSPITAL_COMMUNITY): Payer: Self-pay | Admitting: General Surgery

## 2013-05-19 NOTE — Op Note (Signed)
NAMEJAAZIEL, PEATROSS NO.:  [id number]  MEDICAL RECORD NO.:  [id number]  LOCATION:  [facility]                          FACILITY:  [facility]  PHYSICIAN:  Barbaraann Barthel, M.D. DATE OF BIRTH:  [birth year]  DATE OF PROCEDURE:  05/18/2013 DATE OF DISCHARGE:                              OPERATIVE REPORT   SURGEON:  Barbaraann Barthel, M.D.  DIAGNOSIS:  Right inguinal hernia (direct defect).  PROCEDURE:  Right inguinal herniorrhaphy (modified McVay repair without mesh).  SPECIMEN:  Lipomatous properitoneal fat of the cord.  WOULD CLASSIFICATION:  Clean.  NOTE:  This is a 62 year old white male, who had noticed a mass in his right groin and there was increasing size and discomfort over the last 2 months.  He was referred by Dr. Theressa Millard for elective repair.  We discussed the surgery with him in detail, discussing complications not limited to but including bleeding, infection, and recurrence.  Informed consent was obtained.  GROSS OPERATIVE FINDINGS:  Consistent with a direct defect and he had a prominent lipomatous tissue of the cord which was removed and sent as a specimen.  No other abnormalities noted.  TECHNIQUE:  The patient was placed in supine position.  After the adequate administration of general anesthesia, a Foley catheter was aseptically inserted and he was prepped with Betadine solution and draped in usual manner.  An incision was carried out between the anterior-superior iliac spine and the pubic tubercle through skin, subcutaneous tissue, and opening the external oblique through the external ring.  The cord structures were then dissected free.  There was a large lipomatous tissue that was noted.  This was clamped and removed and ligated and sent as a specimen.  The other direct defect which was noted was then reduced and we repaired the hernia defect suturing transversus abdominis and transversalis fascia to Cooper's ligament and Poupart's ligament  with interrupted 2-0 Bralon sutures.  A relaxing incision was carried out prior to cinching this tightly.  I used approximately 13 mL of 0.5% Sensorcaine for postoperative comfort and then closed the external oblique over the cord structures.  Subcu was irrigated.  The skin was approximated with stapling device.  Prior to closure, all sponge, needle, and instrument counts were found to be correct.  Estimated blood loss was less than 20 mL.  The patient received approximately 1200 mL of crystalloids intraoperatively.  No drains were placed.  There were no complications.     Barbaraann Barthel, M.D.     WB/MEDQ  D:  05/18/2013  T:  05/18/2013  Job:  [id number]  cc:   Theressa Millard, M.D. Fax: [phone]

## 2015-10-13 ENCOUNTER — Other Ambulatory Visit: Payer: Self-pay | Admitting: Gastroenterology

## 2015-11-11 ENCOUNTER — Encounter (HOSPITAL_COMMUNITY): Payer: Self-pay | Admitting: *Deleted

## 2015-11-28 ENCOUNTER — Encounter (HOSPITAL_COMMUNITY)
Admission: RE | Disposition: A | Discharge status: Home / Self Care | Payer: Self-pay | Source: Ambulatory Visit | Attending: Gastroenterology

## 2015-11-28 ENCOUNTER — Ambulatory Visit (HOSPITAL_COMMUNITY): Payer: Medicare Other | Admitting: Anesthesiology

## 2015-11-28 ENCOUNTER — Ambulatory Visit (HOSPITAL_COMMUNITY)
Admission: RE | Admit: 2015-11-28 | Discharge: 2015-11-28 | Disposition: A | Discharge status: Home / Self Care | Payer: Medicare Other | Source: Ambulatory Visit | Attending: Gastroenterology | Admitting: Gastroenterology

## 2015-11-28 ENCOUNTER — Encounter (HOSPITAL_COMMUNITY): Payer: Self-pay

## 2015-11-28 DIAGNOSIS — K635 Polyp of colon: Secondary | ICD-10-CM | POA: Insufficient documentation

## 2015-11-28 DIAGNOSIS — E7800 Pure hypercholesterolemia, unspecified: Secondary | ICD-10-CM | POA: Diagnosis not present

## 2015-11-28 DIAGNOSIS — Z87891 Personal history of nicotine dependence: Secondary | ICD-10-CM | POA: Insufficient documentation

## 2015-11-28 DIAGNOSIS — K5730 Diverticulosis of large intestine without perforation or abscess without bleeding: Secondary | ICD-10-CM | POA: Insufficient documentation

## 2015-11-28 DIAGNOSIS — Z89022 Acquired absence of left finger(s): Secondary | ICD-10-CM | POA: Insufficient documentation

## 2015-11-28 DIAGNOSIS — G8929 Other chronic pain: Secondary | ICD-10-CM | POA: Insufficient documentation

## 2015-11-28 DIAGNOSIS — Z1211 Encounter for screening for malignant neoplasm of colon: Secondary | ICD-10-CM | POA: Insufficient documentation

## 2015-11-28 DIAGNOSIS — D123 Benign neoplasm of transverse colon: Secondary | ICD-10-CM | POA: Diagnosis not present

## 2015-11-28 DIAGNOSIS — G709 Myoneural disorder, unspecified: Secondary | ICD-10-CM | POA: Diagnosis not present

## 2015-11-28 DIAGNOSIS — Z86010 Personal history of colonic polyps: Secondary | ICD-10-CM | POA: Insufficient documentation

## 2015-11-28 HISTORY — PX: COLONOSCOPY WITH PROPOFOL: SHX5780

## 2015-11-28 HISTORY — DX: Personal history of urinary calculi: Z87.442

## 2015-11-28 SURGERY — COLONOSCOPY WITH PROPOFOL
Anesthesia: Monitor Anesthesia Care

## 2015-11-28 MED ORDER — PROPOFOL 500 MG/50ML IV EMUL
INTRAVENOUS | Status: DC | PRN
  Administered 2015-11-28: 100 ug/kg/min via INTRAVENOUS

## 2015-11-28 MED ORDER — LACTATED RINGERS IV SOLN
INTRAVENOUS | Status: DC
  Administered 2015-11-28: 1000 mL via INTRAVENOUS

## 2015-11-28 MED ORDER — PROPOFOL 500 MG/50ML IV EMUL
INTRAVENOUS | Status: DC | PRN
  Administered 2015-11-28 (×2): 30 mg via INTRAVENOUS

## 2015-11-28 MED ORDER — SODIUM CHLORIDE 0.9 % IV SOLN: INTRAVENOUS | Status: DC

## 2015-11-28 SURGICAL SUPPLY — 22 items

## 2015-11-28 NOTE — Discharge Instructions (Signed)
Monitored Anesthesia Care Monitored anesthesia care is an anesthesia service for a medical procedure. Anesthesia is the loss of the ability to feel pain. It is produced by medicines called anesthetics. It may affect a small area of your body (local anesthesia), a large area of your body (regional anesthesia), or your entire body (general anesthesia). The need for monitored anesthesia care depends your procedure, your condition, and the potential need for regional or general anesthesia. It is often provided during procedures where:   General anesthesia may be needed if there are complications. This is because you need special care when you are under general anesthesia.   You will be under local or regional anesthesia. This is so that you are able to have higher levels of anesthesia if needed.   You will receive calming medicines (sedatives). This is especially the case if sedatives are given to put you in a semi-conscious state of relaxation (deep sedation). This is because the amount of sedative needed to produce this state can be hard to predict. Too much of a sedative can produce general anesthesia. Monitored anesthesia care is performed by one or more health care providers who have special training in all types of anesthesia. You will need to meet with these health care providers before your procedure. During this meeting, they will ask you about your medical history. They will also give you instructions to follow. (For example, you will need to stop eating and drinking before your procedure. You may also need to stop or change medicines you are taking.) During your procedure, your health care providers will stay with you. They will:   Watch your condition. This includes watching your blood pressure, breathing, and level of pain.   Diagnose and treat problems that occur.   Give medicines if they are needed. These may include calming medicines (sedatives) and anesthetics.   Make sure you are  comfortable.  Having monitored anesthesia care does not necessarily mean that you will be under anesthesia. It does mean that your health care providers will be able to manage anesthesia if you need it or if it occurs. It also means that you will be able to have a different type of anesthesia than you are having if you need it. When your procedure is complete, your health care providers will continue to watch your condition. They will make sure any medicines wear off before you are allowed to go home.    This information is not intended to replace advice given to you by your health care provider. Make sure you discuss any questions you have with your health care provider.   Document Released: 06/13/2005 Document Revised: 10/08/2014 Document Reviewed: 10/29/2012 Elsevier Interactive Patient Education 2016 [employer] Colonoscopy, Care After Refer to this sheet in the next few weeks. These instructions provide you with information on caring for yourself after your procedure. Your health care provider may also give you more specific instructions. Your treatment has been planned according to current medical practices, but problems sometimes occur. Call your health care provider if you have any problems or questions after your procedure. WHAT TO EXPECT AFTER THE PROCEDURE  After your procedure, it is typical to have the following:  A small amount of blood in your stool.  Moderate amounts of gas and mild abdominal cramping or bloating. HOME CARE INSTRUCTIONS  Do not drive, operate machinery, or sign important documents for 24 hours.  You may shower and resume your regular physical activities, but move at a slower pace for  the first 24 hours.  Take frequent rest periods for the first 24 hours.  Walk around or put a warm pack on your abdomen to help reduce abdominal cramping and bloating.  Drink enough fluids to keep your urine clear or pale yellow.  You may resume your normal diet as  instructed by your health care provider. Avoid heavy or fried foods that are hard to digest.  Avoid drinking alcohol for 24 hours or as instructed by your health care provider.  Only take over-the-counter or prescription medicines as directed by your health care provider.  If a tissue sample (biopsy) was taken during your procedure:  Do not take aspirin or blood thinners for 7 days, or as instructed by your health care provider.  Do not drink alcohol for 7 days, or as instructed by your health care provider.  Eat soft foods for the first 24 hours. SEEK MEDICAL CARE IF: You have persistent spotting of blood in your stool 2-3 days after the procedure. SEEK IMMEDIATE MEDICAL CARE IF:  You have more than a small spotting of blood in your stool.  You pass large blood clots in your stool.  Your abdomen is swollen (distended).  You have nausea or vomiting.  You have a fever.  You have increasing abdominal pain that is not relieved with medicine.   This information is not intended to replace advice given to you by your health care provider. Make sure you discuss any questions you have with your health care provider.   Document Released: 05/01/2004 Document Revised: 07/08/2013 Document Reviewed: 05/25/2013 Elsevier Interactive Patient Education [employer].

## 2015-11-28 NOTE — Anesthesia Preprocedure Evaluation (Signed)
Anesthesia Evaluation  Patient identified by MRN, date of birth, ID band Patient awake    Reviewed: Allergy & Precautions, NPO status , Patient's Chart, lab work & pertinent test results  History of Anesthesia Complications Negative for: history of anesthetic complications  Airway Mallampati: II  TM Distance: >3 FB Neck ROM: Full    Dental  (+) Teeth Intact   Pulmonary neg shortness of breath, neg sleep apnea, neg COPD, neg recent URI, former smoker,    breath sounds clear to auscultation       Cardiovascular negative cardio ROS   Rhythm:Regular     Neuro/Psych  Headaches, neg Seizures  Neuromuscular disease    GI/Hepatic Neg liver ROS, GERD  Controlled,  Endo/Other  negative endocrine ROS  Renal/GU negative Renal ROS     Musculoskeletal   Abdominal   Peds  Hematology negative hematology ROS (+)   Anesthesia Other Findings   Reproductive/Obstetrics                             Anesthesia Physical Anesthesia Plan  ASA: II  Anesthesia Plan: MAC   Post-op Pain Management:    Induction: Intravenous  Airway Management Planned: Natural Airway, Nasal Cannula and Simple Face Mask  Additional Equipment: None  Intra-op Plan:   Post-operative Plan:   Informed Consent: I have reviewed the patients History and Physical, chart, labs and discussed the procedure including the risks, benefits and alternatives for the proposed anesthesia with the patient or authorized representative who has indicated his/her understanding and acceptance.   Dental advisory given  Plan Discussed with: CRNA and Surgeon  Anesthesia Plan Comments:         Anesthesia Quick Evaluation

## 2015-11-28 NOTE — H&P (Signed)
  Procedure: Surveillance colonoscopy. 09/26/2010 normal surveillance colonoscopy was performed. 10/28/2004 colonoscopy was performed with removal of a 2 mm adenomatous sigmoid colon polyp  History: The patient is a 65 year old male born 1951-07-31. He is scheduled to undergo a surveillance colonoscopy today.  Past history: Hernia repair surgery. Lumbar laminectomy. Migraine headache syndrome. Hypercholesterolemia. 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 Kidney stone. Allergic rhinitis. Chronic pain following back surgery.  Exam: The patient is alert and lying comfortably on the endoscopy stretcher. Abdomen is soft and nontender to palpation. Lungs are clear to auscultation. Cardiac exam reveals a regular rhythm.  Plan: Proceed with surveillance colonoscopy

## 2015-11-28 NOTE — Transfer of Care (Signed)
Immediate Anesthesia Transfer of Care Note  Patient: [place]  Procedure(s) Performed: Procedure(s): COLONOSCOPY WITH PROPOFOL (N/A)  Patient Location: PACU  Anesthesia Type:MAC  Level of Consciousness: sedated, patient cooperative and responds to stimulation  Airway & Oxygen Therapy: Patient Spontanous Breathing and Patient connected to face mask oxygen  Post-op Assessment: Report given to RN and Post -op Vital signs reviewed and stable  Post vital signs: Reviewed and stable  Last Vitals:  Filed Vitals:   11/28/15 0717  BP: 157/82  Pulse: 73  Temp: 36.6 C  Resp: 16    Complications: No apparent anesthesia complications

## 2015-11-28 NOTE — Op Note (Signed)
Procedure: Surveillance colonoscopy. 09/26/2010 normal surveillance colonoscopy was performed. 10/28/2004 colonoscopy was performed with removal of a 2 mm adenomatous sigmoid colon polyp  Endoscopist: Earle Gell  Premedication: Propofol administered by anesthesia  Procedure: The patient was placed in the left lateral decubitus position. Anal inspection and digital rectal exam were normal. The Pentax pediatric colonoscope was introduced into the rectum and advanced to the cecum. A normal-appearing appendiceal orifice and ileocecal valve were identified. Colonic preparation for the exam today was good. Withdrawal time was 12 minutes  Rectum. Normal. Retroflexed view of the distal rectum was normal  Sigmoid colon and descending colon. Colonic diverticulosis. A 5 mm sessile polyp was removed with the cold snare from the descending colon.  Splenic flexure. Normal  Transverse colon. A 5 mm sessile polyp was removed with the cold snare from the proximal transverse colon.  Hepatic flexure. Normal  Ascending colon. Normal  Cecum and ileocecal valve. Normal  Assessment: A small polyp was removed from the proximal transverse colon and a small polyp was removed from the descending colon. Otherwise normal surveillance colonoscopy  Recommendation: If the polyps returned adenomatous pathologically, schedule a repeat surveillance colonoscopy in 5 years

## 2015-11-29 ENCOUNTER — Encounter (HOSPITAL_COMMUNITY): Payer: Self-pay | Admitting: Gastroenterology

## 2015-12-04 NOTE — Anesthesia Postprocedure Evaluation (Signed)
Anesthesia Post Note  Patient: Jacob [name]  Procedure(s) Performed: Procedure(s) (LRB): COLONOSCOPY WITH PROPOFOL (N/A)  Patient location during evaluation: Endoscopy Anesthesia Type: MAC Level of consciousness: awake Pain management: pain level controlled Vital Signs Assessment: post-procedure vital signs reviewed and stable Respiratory status: spontaneous breathing and respiratory function stable Cardiovascular status: stable Postop Assessment: no signs of nausea or vomiting Anesthetic complications: no    Last Vitals:  Filed Vitals:   11/28/15 0831 11/28/15 0900  BP:  137/80  Pulse:    Temp: 36.6 C   Resp:      Last Pain: There were no vitals filed for this visit.               Wenceslaus Gist

## 2015-12-27 ENCOUNTER — Other Ambulatory Visit: Payer: Self-pay | Admitting: Internal Medicine

## 2015-12-27 ENCOUNTER — Ambulatory Visit
Admission: RE | Admit: 2015-12-27 | Discharge: 2015-12-27 | Disposition: A | Discharge status: Home / Self Care | Payer: Medicare Other | Source: Ambulatory Visit | Attending: Internal Medicine | Admitting: Internal Medicine

## 2015-12-27 DIAGNOSIS — R319 Hematuria, unspecified: Secondary | ICD-10-CM

## 2016-04-12 ENCOUNTER — Ambulatory Visit (INDEPENDENT_AMBULATORY_CARE_PROVIDER_SITE_OTHER): Payer: Medicare Other | Admitting: Cardiovascular Disease

## 2016-04-12 ENCOUNTER — Encounter: Payer: Self-pay | Admitting: Cardiovascular Disease

## 2016-04-12 VITALS — BP 144/82 | HR 63 | Ht 70.0 in | Wt 199.8 lb

## 2016-04-12 DIAGNOSIS — R0609 Other forms of dyspnea: Secondary | ICD-10-CM | POA: Diagnosis not present

## 2016-04-12 DIAGNOSIS — R0600 Dyspnea, unspecified: Secondary | ICD-10-CM | POA: Insufficient documentation

## 2016-04-12 DIAGNOSIS — R0789 Other chest pain: Secondary | ICD-10-CM | POA: Insufficient documentation

## 2016-04-12 NOTE — Patient Instructions (Signed)

## 2016-04-12 NOTE — Progress Notes (Signed)
Cardiology Office Note   Date:  04/12/2016   ID:  Jacob Jacob [name] [birth year], MRN [id number]  PCP:  Jacob Low, MD  Cardiologist:   Jacob Moores, MD   Chief Complaint  Patient Jacob [name] with  . Shortness of Breath   Problem List 1. Dyspnea with exertion. 2. Hyperlipidemia 3. Atypical CP     April 12, 2016  Jacob Jacob [name] is a 65 y.o. male who Jacob [name] for shortness of breath. Referred by Jacob Jacob [name] Has had some loss of balance / inner ear issues . \seems to have intermittant CP  Comes and goes, last for a few seconds.   Has been present for a year.  DOE with any exertion  Retired from [employer]   No PND or orthopnea  Wakes up in the night gasping for breath at times   Has seen Jacob Jacob [name] in the past - had a stress test which was negative .   Past Medical History  Diagnosis Date  . Hypercholesteremia   . Chronic headaches   . Hypercholesterolemia   . GERD (gastroesophageal reflux disease)   . Otitis media   . Skin cancer of nose   . History of kidney stones     x1 episode  . Lumbar radiculopathy     lower back pain- both legs has numbness varies    Past Surgical History  Procedure Laterality Date  . Skin cancer excision      nose  . Back surgery      laminectomy-lumbar x3  . Inguinal hernia repair Right 05/18/2013    Procedure: RIGHT INGUINAL HERNIA REPAIR ADULT;  Surgeon: Jacob Ran, MD;  Location: [facility];  Service: General;  Laterality: Right;  (no mesh)  . Vasectomy    . Colonoscopy with propofol N/A 11/28/2015    Procedure: COLONOSCOPY WITH PROPOFOL;  Surgeon: Jacob Fair, MD;  Location: [facility];  Service: Endoscopy;  Laterality: N/A;     Current Outpatient Prescriptions  Medication Sig Dispense Refill  . atorvastatin (LIPITOR) 40 MG tablet Take 40 mg by mouth daily.    . diazepam (VALIUM) 10 MG tablet Take 10 mg by mouth daily as needed. anxiety  3  . docusate sodium (COLACE) 100 MG capsule Take 100 mg by mouth 2  (two) times daily as needed for mild constipation.    Marland Kitchen esomeprazole (NEXIUM) 40 MG capsule Take 1 capsule by mouth as needed (gerd).     Marland Kitchen HYDROcodone-acetaminophen (NORCO) 10-325 MG tablet Take 1 tablet by mouth every 4 (four) hours as needed. pain  0  . oxyCODONE (OXYCONTIN) 20 MG 12 hr tablet Take 20 mg by mouth every 12 (twelve) hours.    . promethazine (PHENERGAN) 25 MG tablet Take 25-50 mg by mouth every 8 (eight) hours as needed. nausea  5   No current facility-administered medications for this visit.    Allergies:   Review of patient's allergies indicates no known allergies.    Social History:  The patient  reports that he quit smoking about 32 years ago. His smoking use included Cigarettes. He has a 5 pack-year smoking history. He does not have any smokeless tobacco history on file. He reports that he does not drink alcohol or use illicit drugs.   Family History:  The patient's family history includes CAD in his mother.    ROS:  Please see the history of present illness.    Review of Systems: Constitutional:  denies fever, chills, diaphoresis, appetite change and fatigue.  HEENT: denies photophobia, eye pain, redness, hearing loss, ear pain, congestion, sore throat, rhinorrhea, sneezing, neck pain, neck stiffness and tinnitus.  Respiratory: admits to SOB, DOE, cough, chest tightness, and wheezing.  Cardiovascular: admits to chest pain, palpitations and leg swelling.  Gastrointestinal: denies nausea, vomiting, abdominal pain, diarrhea, constipation, blood in stool.  Genitourinary: denies dysuria, urgency, frequency, hematuria, flank pain and difficulty urinating.  Musculoskeletal: denies  myalgias, back pain, joint swelling, arthralgias and gait problem.   Skin: denies pallor, rash and wound.  Neurological: denies dizziness, seizures, syncope, weakness, light-headedness, numbness and headaches.   Hematological: denies adenopathy, easy bruising, personal or family bleeding  history.  Psychiatric/ Behavioral: denies suicidal ideation, mood changes, confusion, nervousness, sleep disturbance and agitation.       All other systems are reviewed and negative.    PHYSICAL EXAM: VS:  BP 144/82 mmHg  Pulse 63  Ht 5\' 10"  (1.778 m)  Wt 199 lb 12.8 oz (90.629 kg)  BMI 28.67 kg/m2 , BMI Body mass index is 28.67 kg/(m^2). GEN: Well nourished, well developed, in no acute distress HEENT: normal Neck: no JVD, carotid bruits, or masses Cardiac: RRR; no murmurs, rubs, or gallops,no edema  Respiratory:  clear to auscultation bilaterally, normal work of breathing GI: soft, nontender, nondistended, + BS MS: no deformity or atrophy Skin: warm and dry, no rash Neuro:  Strength and sensation are intact Psych: normal   EKG:  EKG is ordered today. The ekg ordered today demonstrates  NSR at 63.   NS ST abn.    Recent Labs: No results found for requested labs within last 365 days.    Lipid Panel No results found for: CHOL, TRIG, HDL, CHOLHDL, VLDL, LDLCALC, LDLDIRECT    Wt Readings from Last 3 Encounters:  04/12/16 199 lb 12.8 oz (90.629 kg)  11/28/15 190 lb (86.183 kg)  05/18/13 185 lb (83.915 kg)      Other studies Reviewed: Additional studies/ records that were reviewed today include: . Review of the above records demonstrates:    ASSESSMENT AND PLAN:  1.  Dyspnea on exertion: Jacob Jacob [name] with the shortness breath with exertion. He does not get any regular exercise. He used to exercise on a regular basis. He has seen Dr. Camila [name] in the past and has had a negative treadmill test. We discussed doing a repeat stress nuclear study. I think that our best option is to have him start a regular exercise program and see if he can advance in the exercise program.  Advised him to call me right with he has an exertional symptoms. At this point I think that most of his symptoms are just due to deconditioning.  We'll see that him back as needed.  Current  medicines are reviewed at length with the patient today.  The patient does not have concerns regarding medicines.  Labs/ tests ordered today include:  No orders of the defined types were placed in this encounter.     Disposition:   FU with me as needed      Jacob Moores, MD  04/12/2016 8:10 AM    [address], [place], [place]  [id number] Phone: [phone]; Fax: [phone]

## 2016-07-03 ENCOUNTER — Other Ambulatory Visit: Payer: Self-pay | Admitting: General Surgery

## 2016-07-03 DIAGNOSIS — R160 Hepatomegaly, not elsewhere classified: Secondary | ICD-10-CM

## 2016-07-17 ENCOUNTER — Ambulatory Visit
Admission: RE | Admit: 2016-07-17 | Discharge: 2016-07-17 | Disposition: A | Discharge status: Home / Self Care | Payer: Medicare Other | Source: Ambulatory Visit | Attending: General Surgery | Admitting: General Surgery

## 2016-07-17 DIAGNOSIS — R160 Hepatomegaly, not elsewhere classified: Secondary | ICD-10-CM

## 2016-07-17 MED ORDER — GADOBENATE DIMEGLUMINE 529 MG/ML IV SOLN
19.0000 mL | Freq: Once | INTRAVENOUS | Status: AC | PRN
  Administered 2016-07-17: 19 mL via INTRAVENOUS

## 2016-07-17 NOTE — Progress Notes (Signed)
Please let patient know that the previous CT finding in the liver looks completely benign on the MRI.  It looks like the finding was related to the blood flow in the liver and how the timing of the contrast was captured in the pictures.

## 2017-01-25 IMAGING — CR DG ABDOMEN 1V
1 series · 1 of 1 positions shown · non-contrast
Comparison: None

CLINICAL DATA: Microscopic hematuria, low back pain, history kidney
stones

EXAM:
ABDOMEN - 1 VIEW

[t abdomen supine]
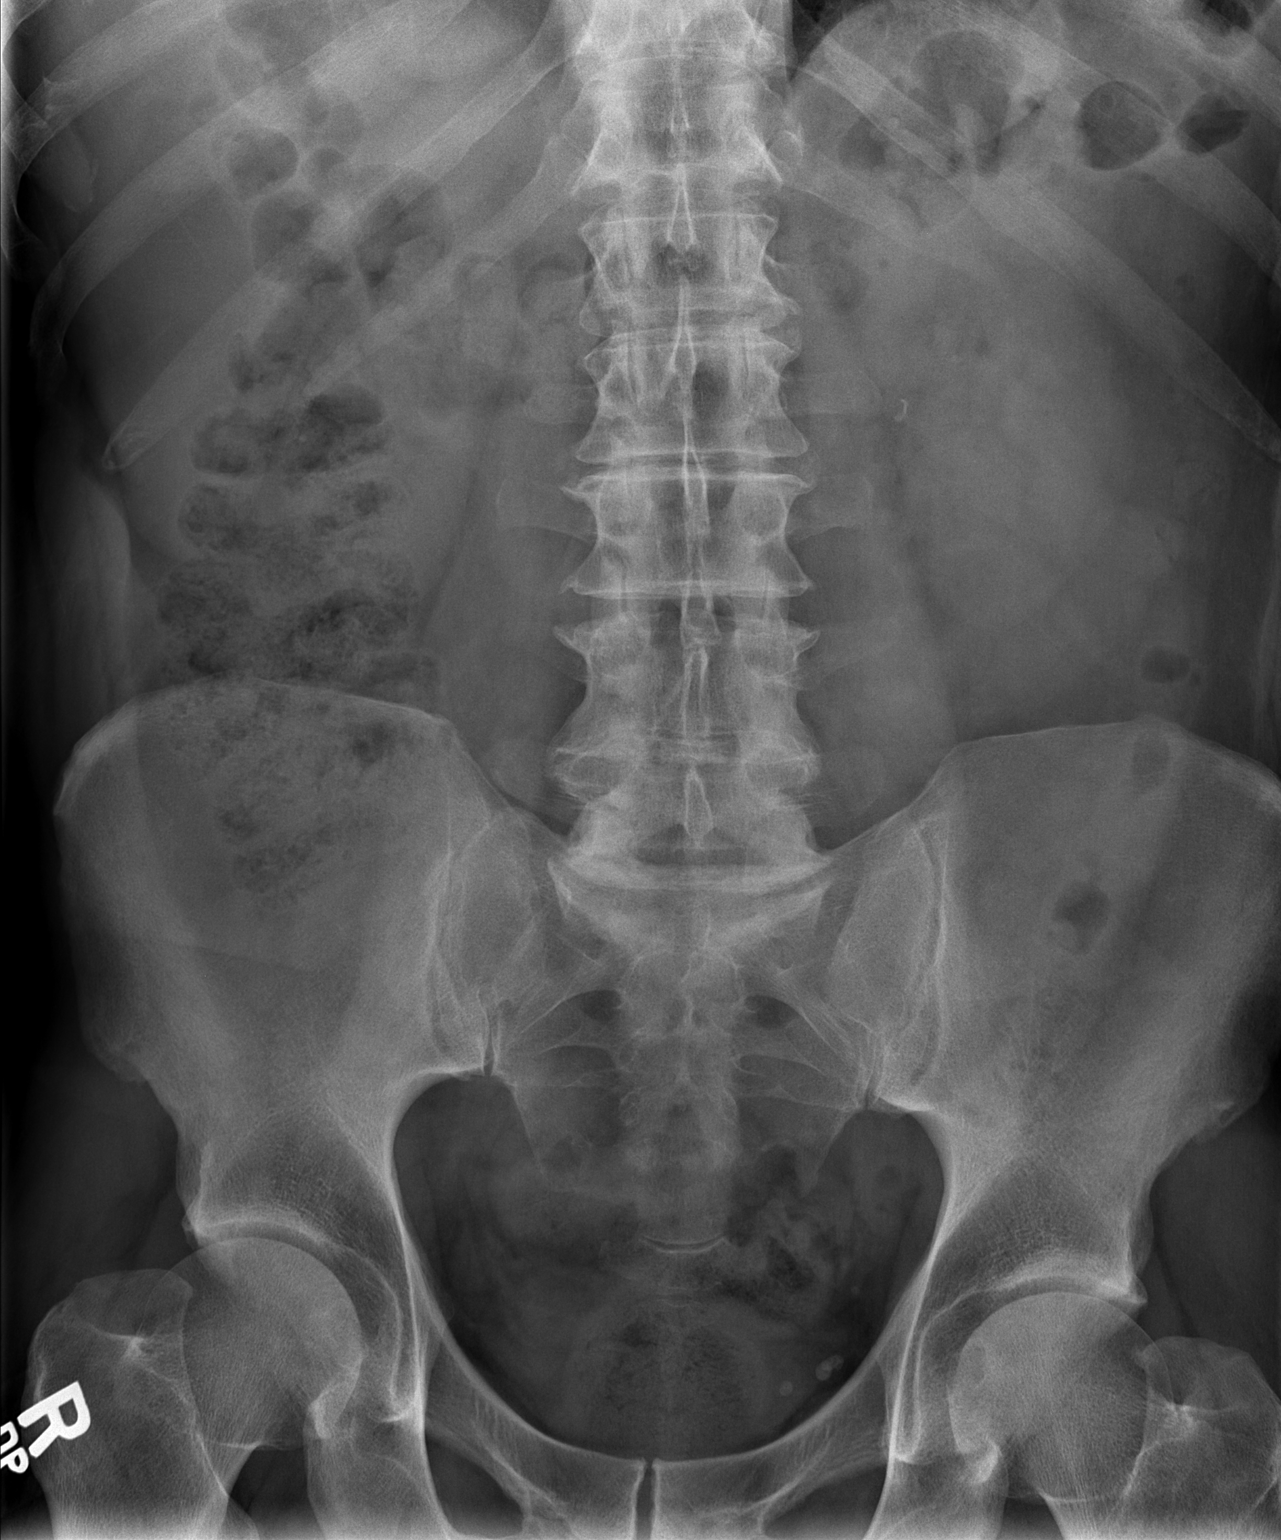

[1 of 1 positions shown; findings below may reference images not displayed]

FINDINGS: Curvilinear calcification projects LEFT paraspinal at L2, new since
previous exam, question vascular versus artifact.

Multiple LEFT pelvic phleboliths.

Questionable tiny nonobstructing LEFT renal calculus.

No additional urinary tract calcifications.

Bowel gas pattern normal.

No acute osseous findings.
IMPRESSION: Questionable tiny nonobstructing LEFT renal calculus.

## 2018-07-15 ENCOUNTER — Emergency Department (HOSPITAL_COMMUNITY)
Admission: EM | Admit: 2018-07-15 | Discharge: 2018-07-15 | Disposition: A | Discharge status: Home / Self Care | Payer: Medicare Other | Attending: Emergency Medicine | Admitting: Emergency Medicine

## 2018-07-15 ENCOUNTER — Emergency Department (HOSPITAL_COMMUNITY): Payer: Medicare Other

## 2018-07-15 ENCOUNTER — Other Ambulatory Visit: Payer: Self-pay

## 2018-07-15 ENCOUNTER — Encounter (HOSPITAL_COMMUNITY): Payer: Self-pay

## 2018-07-15 DIAGNOSIS — Z85828 Personal history of other malignant neoplasm of skin: Secondary | ICD-10-CM | POA: Insufficient documentation

## 2018-07-15 DIAGNOSIS — Z79899 Other long term (current) drug therapy: Secondary | ICD-10-CM | POA: Diagnosis not present

## 2018-07-15 DIAGNOSIS — Z87891 Personal history of nicotine dependence: Secondary | ICD-10-CM | POA: Diagnosis not present

## 2018-07-15 DIAGNOSIS — R0600 Dyspnea, unspecified: Secondary | ICD-10-CM

## 2018-07-15 LAB — COMPREHENSIVE METABOLIC PANEL
ALBUMIN: 4.4 g/dL (ref 3.5–5.0)
ALT: 24 U/L (ref 0–44)
AST: 27 U/L (ref 15–41)
Alkaline Phosphatase: 67 U/L (ref 38–126)
Anion gap: 9 (ref 5–15)
BUN: 11 mg/dL (ref 8–23)
CHLORIDE: 105 mmol/L (ref 98–111)
CO2: 24 mmol/L (ref 22–32)
CREATININE: 0.78 mg/dL (ref 0.61–1.24)
Calcium: 9.2 mg/dL (ref 8.9–10.3)
GFR calc Af Amer: >60 mL/min (ref >60)
GFR calc non Af Amer: >60 mL/min (ref >60)
GLUCOSE: 105 mg/dL — AB (ref 70–99)
Potassium: 4 mmol/L (ref 3.5–5.1)
Sodium: 138 mmol/L (ref 135–145)
TOTAL PROTEIN: 8.3 g/dL — AB (ref 6.5–8.1)
Total Bilirubin: 1.1 mg/dL (ref 0.3–1.2)

## 2018-07-15 LAB — CBC WITH DIFFERENTIAL/PLATELET
ABS IMMATURE GRANULOCYTES: 0.05 10*3/uL (ref 0.00–0.07)
BASOS PCT: 2 %
Basophils Absolute: 0.1 10*3/uL (ref 0.0–0.1)
Eosinophils Absolute: 0.6 10*3/uL — ABNORMAL HIGH (ref 0.0–0.5)
Eosinophils Relative: 8 %
HEMATOCRIT: 45.9 % (ref 39.0–52.0)
Hemoglobin: 14.6 g/dL (ref 13.0–17.0)
IMMATURE GRANULOCYTES: 1 %
LYMPHS ABS: 1.5 10*3/uL (ref 0.7–4.0)
Lymphocytes Relative: 22 %
MCH: 30.2 pg (ref 26.0–34.0)
MCHC: 31.8 g/dL (ref 30.0–36.0)
MCV: 94.8 fL (ref 80.0–100.0)
MONOS PCT: 9 %
Monocytes Absolute: 0.6 10*3/uL (ref 0.1–1.0)
NEUTROS ABS: 4.1 10*3/uL (ref 1.7–7.7)
NEUTROS PCT: 58 %
PLATELETS: 255 10*3/uL (ref 150–400)
RBC: 4.84 MIL/uL (ref 4.22–5.81)
RDW: 12.8 % (ref 11.5–15.5)
WBC: 7 10*3/uL (ref 4.0–10.5)
nRBC: 0 % (ref 0.0–0.2)

## 2018-07-15 LAB — D-DIMER, QUANTITATIVE (NOT AT ARMC): D DIMER QUANT: 0.52 ug{FEU}/mL — AB (ref 0.00–0.50)

## 2018-07-15 LAB — TROPONIN I

## 2018-07-15 MED ORDER — ALBUTEROL SULFATE HFA 108 (90 BASE) MCG/ACT IN AERS: 1.0000 | INHALATION_SPRAY | Freq: Four times a day (QID) | RESPIRATORY_TRACT | 0 refills | Status: AC | PRN

## 2018-07-15 NOTE — ED Triage Notes (Signed)
Pt starting having SOB last week which was exacerbated yesterday with walking. Is currently having chest tightness. Last night pt had a throbbing in his neck as well. Called PCP and they advised him to come here

## 2018-07-15 NOTE — ED Notes (Signed)
Pt having some SOB with activity, complaints of indigestion x 1 week.

## 2018-07-15 NOTE — Discharge Instructions (Addendum)
Prescription for inhaler.  Tests were good.  Follow-up with your primary care doctor.

## 2018-07-16 NOTE — ED Provider Notes (Signed)
[facility] EMERGENCY DEPARTMENT Provider Note   CSN: [id number] Arrival date & time: 07/15/18  0909     History   Chief Complaint Chief Complaint  Patient presents with  . Shortness of Breath    HPI Jacob [name] is a 67 y.o. male.  Shortness of breath intermittently for 2 days not associated with any activities with chest tightness.  Past medical history includes hypercholesterolemia, but no previous MI, diabetes, hypertension.  He has had 4 back surgeries and cholecystectomy.  Questionable diaphoresis, no nausea.  He had a negative stress test approximately 2 years ago.  Cardiology note from July 2017 reviewed.  No prolonged travel, immobilization, recent surgery, cancer diagnosis.  Severity of symptoms is mild to moderate.  Nothing makes symptoms better or worse.     Past Medical History:  Diagnosis Date  . Chronic headaches   . GERD (gastroesophageal reflux disease)   . History of kidney stones    x1 episode  . Hypercholesteremia   . Hypercholesterolemia   . Lumbar radiculopathy    lower back pain- both legs has numbness varies  . Otitis media   . Skin cancer of nose     Patient Active Problem List   Diagnosis Date Noted  . DOE (dyspnea on exertion) 04/12/2016  . Atypical chest pain 04/12/2016    Past Surgical History:  Procedure Laterality Date  . BACK SURGERY     laminectomy-lumbar x3  . COLONOSCOPY WITH PROPOFOL N/A 11/28/2015   Procedure: COLONOSCOPY WITH PROPOFOL;  Surgeon: Garlan Fair, MD;  Location: [facility];  Service: Endoscopy;  Laterality: N/A;  . INGUINAL HERNIA REPAIR Right 05/18/2013   Procedure: RIGHT INGUINAL HERNIA REPAIR ADULT;  Surgeon: Scherry Ran, MD;  Location: [facility];  Service: General;  Laterality: Right;  (no mesh)  . SKIN CANCER EXCISION     nose  . VASECTOMY          Home Medications    Prior to Admission medications   Medication Sig Start Date End Date Taking? Authorizing Provider  atorvastatin (LIPITOR) 40  MG tablet Take 40 mg by mouth daily.   Yes [provider]  docusate sodium (COLACE) 100 MG capsule Take 100 mg by mouth 2 (two) times daily as needed for mild constipation.   Yes [provider]  fluticasone (FLONASE) 50 MCG/ACT nasal spray SHAKE LQ AND U 2 SPRAYS IEN QD 05/20/18  Yes [provider]  HYDROcodone-acetaminophen (NORCO) 10-325 MG tablet Take 1 tablet by mouth every 4 (four) hours as needed. pain 11/08/15  Yes [provider]  meloxicam (MOBIC) 15 MG tablet Take 15 mg by mouth as needed.    Yes [provider]  oxyCODONE (OXYCONTIN) 20 MG 12 hr tablet Take 20 mg by mouth every 12 (twelve) hours.   Yes [provider]  albuterol (PROVENTIL HFA;VENTOLIN HFA) 108 (90 Base) MCG/ACT inhaler Inhale 1-2 puffs into the lungs every 6 (six) hours as needed for wheezing or shortness of breath. 07/15/18   Nat Christen, MD  esomeprazole (NEXIUM) 40 MG capsule Take 1 capsule by mouth as needed (gerd).  10/06/10   [provider]  promethazine (PHENERGAN) 25 MG tablet Take 25-50 mg by mouth every 8 (eight) hours as needed. nausea 09/06/15   [provider]    Family History Family History  Problem Relation Age of Onset  . CAD Mother     Social History Social History   Tobacco Use  . Smoking status: Former Smoker  Packs/day: 1.00    Years: 5.00    Pack years: 5.00    Types: Cigarettes    Last attempt to quit: 05/15/1983    Years since quitting: 35.1  . Smokeless tobacco: Never Used  Substance Use Topics  . Alcohol use: No  . Drug use: No     Allergies   Patient has no known allergies.   Review of Systems Review of Systems  All other systems reviewed and are negative.    Physical Exam Updated Vital Signs BP 137/79 (BP Location: Right Arm)   Pulse 60   Temp 98.2 F (36.8 C) (Oral)   Resp 18   Ht 5\' 9"  (1.753 m)   Wt 86.2 kg   SpO2 98%   BMI 28.06 kg/m   Physical Exam  Constitutional: He is  oriented to person, place, and time. He appears well-developed and well-nourished.  HENT:  Head: Normocephalic and atraumatic.  Eyes: Conjunctivae are normal.  Neck: Neck supple.  Cardiovascular: Normal rate and regular rhythm.  Pulmonary/Chest: Effort normal and breath sounds normal.  Abdominal: Soft. Bowel sounds are normal.  Musculoskeletal: Normal range of motion.  Neurological: He is alert and oriented to person, place, and time.  Skin: Skin is warm and dry.  Psychiatric: He has a normal mood and affect. His behavior is normal.  Nursing note and vitals reviewed.    ED Treatments / Results  Labs (all labs ordered are listed, but only abnormal results are displayed) Labs Reviewed  CBC WITH DIFFERENTIAL/PLATELET - Abnormal; Notable for the following components:      Result Value   Eosinophils Absolute 0.6 (*)    All other components within normal limits  COMPREHENSIVE METABOLIC PANEL - Abnormal; Notable for the following components:   Glucose, Bld 105 (*)    Total Protein 8.3 (*)    All other components within normal limits  D-DIMER, QUANTITATIVE (NOT AT [facility]) - Abnormal; Notable for the following components:   D-Dimer, Quant 0.52 (*)    All other components within normal limits  TROPONIN I    EKG EKG Interpretation  Date/Time:  Tuesday July 15 2018 09:23:10 EDT Ventricular Rate:  75 PR Interval:    QRS Duration: 94 QT Interval:  392 QTC Calculation: 438 R Axis:   7 Text Interpretation:  Sinus rhythm RSR' in V1 or V2, right VCD or RVH Confirmed by Nat Christen [phone]) on 07/15/2018 1:02:28 PM   Radiology Dg Chest 2 View  Result Date: 07/15/2018 CLINICAL DATA:  Difficulty breathing for 1 week. Neck pain last night. EXAM: CHEST - 2 VIEW COMPARISON:  None. FINDINGS: The heart size and mediastinal contours are within normal limits. Both lungs are clear. The visualized skeletal structures are unremarkable. IMPRESSION: No active cardiopulmonary disease. Electronically  Signed   By: Dorise Bullion III M.D   On: 07/15/2018 11:27    Procedures Procedures (including critical care time)  Medications Ordered in ED Medications - No data to display   Initial Impression / Assessment and Plan / ED Course  I have reviewed the triage vital signs and the nursing notes.  Pertinent labs & imaging results that were available during my care of the patient were reviewed by me and considered in my medical decision making (see chart for details).    Patient presents with dyspnea intermittently for 2 days.  Risk factor profile for PE low.  Screening tests including EKG, chest x-ray, labs, troponin, d-dimer all acceptable.  Patient encouraged to get primary care follow-up.  07/18/2018 at 1130: I called the patient to check on him.  He said he was feeling better.  I encouraged him to seek a cardiology consultation for further evaluation.  Final Clinical Impressions(s) / ED Diagnoses   Final diagnoses:  Dyspnea, unspecified type    ED Discharge Orders         Ordered    albuterol (PROVENTIL HFA;VENTOLIN HFA) 108 (90 Base) MCG/ACT inhaler  Every 6 hours PRN     07/15/18 1338           Nat Christen, MD 07/18/18 1137

## 2020-10-05 DIAGNOSIS — M542 Cervicalgia: Secondary | ICD-10-CM | POA: Diagnosis not present

## 2020-10-05 DIAGNOSIS — M47817 Spondylosis without myelopathy or radiculopathy, lumbosacral region: Secondary | ICD-10-CM | POA: Diagnosis not present

## 2020-10-05 DIAGNOSIS — G894 Chronic pain syndrome: Secondary | ICD-10-CM | POA: Diagnosis not present

## 2020-11-02 DIAGNOSIS — G894 Chronic pain syndrome: Secondary | ICD-10-CM | POA: Diagnosis not present

## 2020-11-02 DIAGNOSIS — M47817 Spondylosis without myelopathy or radiculopathy, lumbosacral region: Secondary | ICD-10-CM | POA: Diagnosis not present

## 2020-11-02 DIAGNOSIS — M542 Cervicalgia: Secondary | ICD-10-CM | POA: Diagnosis not present

## 2020-11-23 DIAGNOSIS — L821 Other seborrheic keratosis: Secondary | ICD-10-CM | POA: Diagnosis not present

## 2020-11-23 DIAGNOSIS — L570 Actinic keratosis: Secondary | ICD-10-CM | POA: Diagnosis not present

## 2020-11-23 DIAGNOSIS — L738 Other specified follicular disorders: Secondary | ICD-10-CM | POA: Diagnosis not present

## 2020-11-23 DIAGNOSIS — D229 Melanocytic nevi, unspecified: Secondary | ICD-10-CM | POA: Diagnosis not present

## 2020-11-23 DIAGNOSIS — L819 Disorder of pigmentation, unspecified: Secondary | ICD-10-CM | POA: Diagnosis not present

## 2020-11-23 DIAGNOSIS — L814 Other melanin hyperpigmentation: Secondary | ICD-10-CM | POA: Diagnosis not present

## 2020-11-30 DIAGNOSIS — M47817 Spondylosis without myelopathy or radiculopathy, lumbosacral region: Secondary | ICD-10-CM | POA: Diagnosis not present

## 2020-11-30 DIAGNOSIS — G894 Chronic pain syndrome: Secondary | ICD-10-CM | POA: Diagnosis not present

## 2020-11-30 DIAGNOSIS — Z79891 Long term (current) use of opiate analgesic: Secondary | ICD-10-CM | POA: Diagnosis not present

## 2020-11-30 DIAGNOSIS — Z79899 Other long term (current) drug therapy: Secondary | ICD-10-CM | POA: Diagnosis not present

## 2020-12-22 DIAGNOSIS — K219 Gastro-esophageal reflux disease without esophagitis: Secondary | ICD-10-CM | POA: Diagnosis not present

## 2020-12-22 DIAGNOSIS — G8929 Other chronic pain: Secondary | ICD-10-CM | POA: Diagnosis not present

## 2020-12-22 DIAGNOSIS — G43009 Migraine without aura, not intractable, without status migrainosus: Secondary | ICD-10-CM | POA: Diagnosis not present

## 2020-12-22 DIAGNOSIS — E7800 Pure hypercholesterolemia, unspecified: Secondary | ICD-10-CM | POA: Diagnosis not present

## 2020-12-22 DIAGNOSIS — J45909 Unspecified asthma, uncomplicated: Secondary | ICD-10-CM | POA: Diagnosis not present

## 2020-12-28 DIAGNOSIS — I10 Essential (primary) hypertension: Secondary | ICD-10-CM | POA: Diagnosis not present

## 2020-12-28 DIAGNOSIS — M47817 Spondylosis without myelopathy or radiculopathy, lumbosacral region: Secondary | ICD-10-CM | POA: Diagnosis not present

## 2020-12-28 DIAGNOSIS — G894 Chronic pain syndrome: Secondary | ICD-10-CM | POA: Diagnosis not present

## 2021-01-09 DIAGNOSIS — G43009 Migraine without aura, not intractable, without status migrainosus: Secondary | ICD-10-CM | POA: Diagnosis not present

## 2021-01-09 DIAGNOSIS — K219 Gastro-esophageal reflux disease without esophagitis: Secondary | ICD-10-CM | POA: Diagnosis not present

## 2021-01-09 DIAGNOSIS — J45909 Unspecified asthma, uncomplicated: Secondary | ICD-10-CM | POA: Diagnosis not present

## 2021-01-09 DIAGNOSIS — E7800 Pure hypercholesterolemia, unspecified: Secondary | ICD-10-CM | POA: Diagnosis not present

## 2021-01-09 DIAGNOSIS — G8929 Other chronic pain: Secondary | ICD-10-CM | POA: Diagnosis not present

## 2021-01-25 DIAGNOSIS — M5106 Intervertebral disc disorders with myelopathy, lumbar region: Secondary | ICD-10-CM | POA: Diagnosis not present

## 2021-01-25 DIAGNOSIS — M545 Low back pain, unspecified: Secondary | ICD-10-CM | POA: Diagnosis not present

## 2021-01-25 DIAGNOSIS — G894 Chronic pain syndrome: Secondary | ICD-10-CM | POA: Diagnosis not present

## 2021-02-22 DIAGNOSIS — M545 Low back pain, unspecified: Secondary | ICD-10-CM | POA: Diagnosis not present

## 2021-02-22 DIAGNOSIS — G894 Chronic pain syndrome: Secondary | ICD-10-CM | POA: Diagnosis not present

## 2021-03-14 DIAGNOSIS — K219 Gastro-esophageal reflux disease without esophagitis: Secondary | ICD-10-CM | POA: Diagnosis not present

## 2021-03-14 DIAGNOSIS — G43009 Migraine without aura, not intractable, without status migrainosus: Secondary | ICD-10-CM | POA: Diagnosis not present

## 2021-03-14 DIAGNOSIS — G8929 Other chronic pain: Secondary | ICD-10-CM | POA: Diagnosis not present

## 2021-03-14 DIAGNOSIS — J45909 Unspecified asthma, uncomplicated: Secondary | ICD-10-CM | POA: Diagnosis not present

## 2021-03-14 DIAGNOSIS — E7800 Pure hypercholesterolemia, unspecified: Secondary | ICD-10-CM | POA: Diagnosis not present

## 2021-03-22 DIAGNOSIS — M5106 Intervertebral disc disorders with myelopathy, lumbar region: Secondary | ICD-10-CM | POA: Diagnosis not present

## 2021-03-22 DIAGNOSIS — G894 Chronic pain syndrome: Secondary | ICD-10-CM | POA: Diagnosis not present

## 2021-03-22 DIAGNOSIS — M545 Low back pain, unspecified: Secondary | ICD-10-CM | POA: Diagnosis not present

## 2021-04-06 ENCOUNTER — Other Ambulatory Visit: Payer: Self-pay

## 2021-04-06 ENCOUNTER — Other Ambulatory Visit: Payer: Self-pay | Admitting: Internal Medicine

## 2021-04-06 ENCOUNTER — Ambulatory Visit
Admission: RE | Admit: 2021-04-06 | Discharge: 2021-04-06 | Disposition: A | Discharge status: Home / Self Care | Payer: Medicare Other | Source: Ambulatory Visit | Attending: Internal Medicine | Admitting: Internal Medicine

## 2021-04-06 DIAGNOSIS — K219 Gastro-esophageal reflux disease without esophagitis: Secondary | ICD-10-CM | POA: Diagnosis not present

## 2021-04-06 DIAGNOSIS — R030 Elevated blood-pressure reading, without diagnosis of hypertension: Secondary | ICD-10-CM | POA: Diagnosis not present

## 2021-04-06 DIAGNOSIS — R0789 Other chest pain: Secondary | ICD-10-CM | POA: Diagnosis not present

## 2021-04-06 DIAGNOSIS — G43009 Migraine without aura, not intractable, without status migrainosus: Secondary | ICD-10-CM | POA: Diagnosis not present

## 2021-04-06 DIAGNOSIS — Z23 Encounter for immunization: Secondary | ICD-10-CM | POA: Diagnosis not present

## 2021-04-06 DIAGNOSIS — Z0000 Encounter for general adult medical examination without abnormal findings: Secondary | ICD-10-CM | POA: Diagnosis not present

## 2021-04-06 DIAGNOSIS — G8929 Other chronic pain: Secondary | ICD-10-CM | POA: Diagnosis not present

## 2021-04-06 DIAGNOSIS — Z1389 Encounter for screening for other disorder: Secondary | ICD-10-CM | POA: Diagnosis not present

## 2021-04-06 DIAGNOSIS — E7800 Pure hypercholesterolemia, unspecified: Secondary | ICD-10-CM | POA: Diagnosis not present

## 2021-04-20 DIAGNOSIS — G43009 Migraine without aura, not intractable, without status migrainosus: Secondary | ICD-10-CM | POA: Diagnosis not present

## 2021-04-20 DIAGNOSIS — G8929 Other chronic pain: Secondary | ICD-10-CM | POA: Diagnosis not present

## 2021-04-20 DIAGNOSIS — K219 Gastro-esophageal reflux disease without esophagitis: Secondary | ICD-10-CM | POA: Diagnosis not present

## 2021-04-20 DIAGNOSIS — E7800 Pure hypercholesterolemia, unspecified: Secondary | ICD-10-CM | POA: Diagnosis not present

## 2021-04-20 DIAGNOSIS — J45909 Unspecified asthma, uncomplicated: Secondary | ICD-10-CM | POA: Diagnosis not present

## 2021-05-03 DIAGNOSIS — G894 Chronic pain syndrome: Secondary | ICD-10-CM | POA: Diagnosis not present

## 2021-05-03 DIAGNOSIS — M545 Low back pain, unspecified: Secondary | ICD-10-CM | POA: Diagnosis not present

## 2021-05-03 DIAGNOSIS — M5106 Intervertebral disc disorders with myelopathy, lumbar region: Secondary | ICD-10-CM | POA: Diagnosis not present

## 2021-05-04 DIAGNOSIS — R634 Abnormal weight loss: Secondary | ICD-10-CM | POA: Diagnosis not present

## 2021-05-04 DIAGNOSIS — Z791 Long term (current) use of non-steroidal anti-inflammatories (NSAID): Secondary | ICD-10-CM | POA: Diagnosis not present

## 2021-05-15 DIAGNOSIS — R030 Elevated blood-pressure reading, without diagnosis of hypertension: Secondary | ICD-10-CM | POA: Diagnosis not present

## 2021-05-31 DIAGNOSIS — Z79899 Other long term (current) drug therapy: Secondary | ICD-10-CM | POA: Diagnosis not present

## 2021-05-31 DIAGNOSIS — G894 Chronic pain syndrome: Secondary | ICD-10-CM | POA: Diagnosis not present

## 2021-05-31 DIAGNOSIS — Z79891 Long term (current) use of opiate analgesic: Secondary | ICD-10-CM | POA: Diagnosis not present

## 2021-05-31 DIAGNOSIS — M545 Low back pain, unspecified: Secondary | ICD-10-CM | POA: Diagnosis not present

## 2021-06-15 ENCOUNTER — Telehealth: Payer: Self-pay

## 2021-06-15 NOTE — Progress Notes (Signed)
Cardiology Office Note   Date:  06/16/2021   ID:  Gray, Affleck 1951/01/25, MRN [id number]  PCP:  Wenda Low, MD  Cardiologist:   Dorris Carnes, MD   Patient presents for evaluation of SOB     History of Present Illness: Jacob [name] is a 70 y.o. male with a history of HL, atypical CP and dyspnea on exertoin.   He was seen by P Nahser in 2017  Prior to tha by Derl Barrow  A nuclear stress test was done back in 2013 which was negative for ischemia    Patient says that sometimes when walks back and forth across the room gets exhausted   Not all the time though   Does walk some but not a lot   No regular exercise    LImtted by back problems    The pt does have CP     These episodes can occur with and without activity   Occurs 2 to 3 x per week Resolve on own  Does note his heart beating harder at times   Not faster No dizziness   Current Meds  Medication Sig   albuterol (PROVENTIL HFA;VENTOLIN HFA) 108 (90 Base) MCG/ACT inhaler Inhale 1-2 puffs into the lungs every 6 (six) hours as needed for wheezing or shortness of breath.   atorvastatin (LIPITOR) 40 MG tablet Take 40 mg by mouth daily.   docusate sodium (COLACE) 100 MG capsule Take 100 mg by mouth 2 (two) times daily as needed for mild constipation.   esomeprazole (NEXIUM) 40 MG capsule Take 1 capsule by mouth as needed (gerd).    fluticasone (FLONASE) 50 MCG/ACT nasal spray SHAKE LQ AND U 2 SPRAYS IEN QD   HYDROcodone-acetaminophen (NORCO) 10-325 MG tablet Take 1 tablet by mouth every 4 (four) hours as needed. pain   meloxicam (MOBIC) 15 MG tablet Take 15 mg by mouth as needed.    oxyCODONE (OXYCONTIN) 20 MG 12 hr tablet Take 20 mg by mouth every 12 (twelve) hours.   promethazine (PHENERGAN) 25 MG tablet Take 25-50 mg by mouth every 8 (eight) hours as needed. nausea     Allergies:   Patient has no known allergies.   Past Medical History:  Diagnosis Date   Chronic headaches    GERD (gastroesophageal reflux disease)     History of kidney stones    x1 episode   Hypercholesteremia    Hypercholesterolemia    Lumbar radiculopathy    lower back pain- both legs has numbness varies   Otitis media    Skin cancer of nose     Past Surgical History:  Procedure Laterality Date   BACK SURGERY     laminectomy-lumbar x3   COLONOSCOPY WITH PROPOFOL N/A 11/28/2015   Procedure: COLONOSCOPY WITH PROPOFOL;  Surgeon: Garlan Fair, MD;  Location: [facility];  Service: Endoscopy;  Laterality: N/A;   INGUINAL HERNIA REPAIR Right 05/18/2013   Procedure: RIGHT INGUINAL HERNIA REPAIR ADULT;  Surgeon: Scherry Ran, MD;  Location: [facility];  Service: General;  Laterality: Right;  (no mesh)   SKIN CANCER EXCISION     nose   VASECTOMY       Social History:  The patient  reports that he quit smoking about 38 years ago. His smoking use included cigarettes. He has a 5.00 pack-year smoking history. He has never used smokeless tobacco. He reports that he does not drink alcohol and does not use drugs.   Family History:  The patient's  family history includes CAD in his mother.    ROS:  Please see the history of present illness. All other systems are reviewed and  Negative to the above problem except as noted.    PHYSICAL EXAM: VS:  BP (!) 160/80   Pulse 71   Ht '5\' 10"'$  (1.778 m)   Wt 196 lb 6.4 oz (89.1 kg)   SpO2 98%   BMI 28.18 kg/m   GEN: Well nourished, well developed, in no acute distress  HEENT: normal  Neck: no JVD, No bruits  Cardiac: RRR; no murmurs,  No LE  edema  Respiratory:  clear to auscultation bilaterally GI: soft, nontender, nondistended, + BS  No hepatomegaly  MS: no deformity Moving all extremities   Skin: warm and dry, no rash Neuro:  Strength and sensation are intact Psych: euthymic mood, full affect   EKG:  EKG is ordered today.  SR  71 bpm   Nnspecific ST T wave cchanges    Lipid Panel No results found for: CHOL, TRIG, HDL, CHOLHDL, VLDL, LDLCALC, LDLDIRECT    Wt Readings from Last  3 Encounters:  06/16/21 196 lb 6.4 oz (89.1 kg)  07/15/18 190 lb (86.2 kg)  04/12/16 199 lb 12.8 oz (90.6 kg)      ASSESSMENT AND PLAN:  1  DOE   Not clear if this represents anginal equivalent   His activity is limited   COncerning though       Exam:  Moving air well  Volume status looks good  Recomm:  Echo to eval systolic / diastolic function of heart CT coronary angiogram to eval coronaries      2  CP  Atypical   I do not think these spells represent angina  3  HTN   Pt says his BP has been elevated at Dr Marchia Bond office   Will recomm starting an agent    BMET today  to see BUN/CR  THis will dictate choice of meds ? Diuretic vs Ca blocker     4  HL L 87  HDL 51  Keep on same meds for now.    F/U based on test results      Current medicines are reviewed at length with the patient today.  The patient does not have concerns regarding medicines.  Signed, Dorris Carnes, MD  06/16/2021 9:18 AM    [address], [place], [place]  [address] Phone: [phone]; Fax: [phone]

## 2021-06-15 NOTE — Telephone Encounter (Signed)
NOTES ON FILE FROM DR Jordan Likes [phone] SENT REFERRAL TO SCHEDULING

## 2021-06-16 ENCOUNTER — Other Ambulatory Visit (HOSPITAL_COMMUNITY)
Admission: RE | Admit: 2021-06-16 | Discharge: 2021-06-16 | Disposition: A | Discharge status: Home / Self Care | Payer: Medicare Other | Source: Ambulatory Visit | Attending: Internal Medicine | Admitting: Internal Medicine

## 2021-06-16 ENCOUNTER — Encounter: Payer: Self-pay | Admitting: *Deleted

## 2021-06-16 ENCOUNTER — Other Ambulatory Visit: Payer: Self-pay

## 2021-06-16 ENCOUNTER — Encounter: Payer: Self-pay | Admitting: Internal Medicine

## 2021-06-16 ENCOUNTER — Ambulatory Visit: Payer: Medicare Other | Admitting: Internal Medicine

## 2021-06-16 VITALS — BP 160/80 | HR 71 | Ht 70.0 in | Wt 196.4 lb

## 2021-06-16 DIAGNOSIS — R0789 Other chest pain: Secondary | ICD-10-CM

## 2021-06-16 DIAGNOSIS — R0602 Shortness of breath: Secondary | ICD-10-CM | POA: Insufficient documentation

## 2021-06-16 LAB — BASIC METABOLIC PANEL
Anion gap: 8 (ref 5–15)
BUN: 16 mg/dL (ref 8–23)
CO2: 25 mmol/L (ref 22–32)
Calcium: 8.9 mg/dL (ref 8.9–10.3)
Chloride: 106 mmol/L (ref 98–111)
Creatinine, Ser: 0.96 mg/dL (ref 0.61–1.24)
GFR, Estimated: >60 mL/min (ref >60)
Glucose, Bld: 109 mg/dL — ABNORMAL HIGH (ref 70–99)
Potassium: 4 mmol/L (ref 3.5–5.1)
Sodium: 139 mmol/L (ref 135–145)

## 2021-06-16 MED ORDER — METOPROLOL TARTRATE 100 MG PO TABS: 100.0000 mg | ORAL_TABLET | ORAL | 0 refills | Status: DC

## 2021-06-16 NOTE — Patient Instructions (Signed)
Medication Instructions:   Take Lopressor 100 mg 2 hours prior to CT Scan   *If you need a refill on your cardiac medications before your next appointment, please call your pharmacy*   Lab Work: Your physician recommends that you return for lab work in: Today   If you have labs (blood work) drawn today and your tests are completely normal, you will receive your results only by: [place] (if you have MyChart) OR A paper copy in the mail If you have any lab test that is abnormal or we need to change your treatment, we will call you to review the results.   Testing/Procedures: Cardiac CT Scan   Your physician has requested that you have an echocardiogram. Echocardiography is a painless test that uses sound waves to create images of your heart. It provides your doctor with information about the size and shape of your heart and how well your heart's chambers and valves are working. This procedure takes approximately one hour. There are no restrictions for this procedure.    Follow-Up: At [facility], you and your health needs are our priority.  As part of our continuing mission to provide you with exceptional heart care, we have created designated Provider Care Teams.  These Care Teams include your primary Cardiologist (physician) and Advanced Practice Providers (APPs -  Physician Assistants and Nurse Practitioners) who all work together to provide you with the care you need, when you need it.  We recommend signing up for the patient portal called "MyChart".  Sign up information is provided on this After Visit Summary.  MyChart is used to connect with patients for Virtual Visits (Telemedicine).  Patients are able to view lab/test results, encounter notes, upcoming appointments, etc.  Non-urgent messages can be sent to your provider as well.   To learn more about what you can do with MyChart, go to [email or web address].    Your next appointment:    To Be Determined   The format  for your next appointment:   In Person  Provider:   Dorris Carnes, MD   Other Instructions Thank you for choosing [facility]!    Your cardiac CT will be scheduled at one of the below locations:   [facility] [address] [place], [place] [address] [phone]  [place] [address] [place], [place] [address] [phone]  If scheduled at [facility], please arrive at the [facility] main entrance (entrance A) of [facility] 30 minutes prior to test start time. Proceed to the [facility] Radiology Department (first floor) to check-in and test prep.  If scheduled at [facility], please arrive 15 mins early for check-in and test prep.  Please follow these instructions carefully (unless otherwise directed):  Hold all erectile dysfunction medications at least 3 days (72 hrs) prior to test.  On the Night Before the Test: Be sure to Drink plenty of water. Do not consume any caffeinated/decaffeinated beverages or chocolate 12 hours prior to your test. Do not take any antihistamines 12 hours prior to your test. If the patient has contrast allergy: Patient will need a prescription for Prednisone and very clear instructions (as follows): Prednisone 50 mg - take 13 hours prior to test Take another Prednisone 50 mg 7 hours prior to test Take another Prednisone 50 mg 1 hour prior to test Take Benadryl 50 mg 1 hour prior to test Patient must complete all four doses  of above prophylactic medications. Patient will need a ride after test due to Benadryl.  On the Day of the Test: Drink plenty of water until 1 hour prior to the test. Do not eat any food 4 hours prior to the test. You may take your regular medications prior to the test.  Take metoprolol (Lopressor) two hours prior to test. HOLD Furosemide/Hydrochlorothiazide morning of the test. FEMALES- please wear  underwire-free bra if available, avoid dresses & tight clothing   *For Clinical Staff only. Please instruct patient the following:* Heart Rate Medication Recommendations for Cardiac CT  Resting HR < 50 bpm  No medication  Resting HR 50-60 bpm and BP >110/50 mmHG   Consider Metoprolol tartrate 25 mg PO 90-120 min prior to scan  Resting HR 60-65 bpm and BP >110/50 mmHG  Metoprolol tartrate 50 mg PO 90-120 minutes prior to scan   Resting HR > 65 bpm and BP >110/50 mmHG  Metoprolol tartrate 100 mg PO 90-120 minutes prior to scan  Consider Ivabradine 10-15 mg PO or a calcium channel blocker for resting HR >60 bpm and contraindication to metoprolol tartrate  Consider Ivabradine 10-15 mg PO in combination with metoprolol tartrate for HR >80 bpm         After the Test: Drink plenty of water. After receiving IV contrast, you may experience a mild flushed feeling. This is normal. On occasion, you may experience a mild rash up to 24 hours after the test. This is not dangerous. If this occurs, you can take Benadryl 25 mg and increase your fluid intake. If you experience trouble breathing, this can be serious. If it is severe call 911 IMMEDIATELY. If it is mild, please call our office. If you take any of these medications: Glipizide/Metformin, Avandament, Glucavance, please do not take 48 hours after completing test unless otherwise instructed.  Please allow 2-4 weeks for scheduling of routine cardiac CTs. Some insurance companies require a pre-authorization which may delay scheduling of this test.   For non-scheduling related questions, please contact the cardiac imaging nurse navigator should you have any questions/concerns: Marchia Bond, Cardiac Imaging Nurse Navigator Gordy Clement, Cardiac Imaging Nurse Navigator [facility] Direct Office Dial: [phone]   For scheduling needs, including cancellations and rescheduling, please call [place], [phone].

## 2021-06-20 ENCOUNTER — Telehealth: Payer: Self-pay

## 2021-06-20 ENCOUNTER — Telehealth: Payer: Self-pay | Admitting: *Deleted

## 2021-06-20 MED ORDER — AMLODIPINE BESYLATE 5 MG PO TABS: 5.0000 mg | ORAL_TABLET | Freq: Every day | ORAL | 3 refills | Status: DC

## 2021-06-20 NOTE — Telephone Encounter (Signed)
-----   Message from Dorris Carnes V, MD sent at 06/18/2021 10:50 PM EDT ----- ELectrolytes and kidney function are OK  BP was elevated   I would recomm amlodipine 5 mg daily    F/U in clinic in about 4 to 6 wks

## 2021-06-20 NOTE — Telephone Encounter (Signed)
Lmom to schedule 6 week follow up with Dr Harrington Challenger

## 2021-06-20 NOTE — Telephone Encounter (Signed)
Pt notified and order placed 

## 2021-06-23 ENCOUNTER — Telehealth (HOSPITAL_COMMUNITY): Payer: Self-pay | Admitting: *Deleted

## 2021-06-23 NOTE — Telephone Encounter (Signed)
Reaching out to patient to offer assistance regarding upcoming cardiac imaging study; pt verbalizes understanding of appt date/time, parking situation and where to check in, pre-test NPO status and medications ordered, and verified current allergies; name and call back number provided for further questions should they arise  Gordy Clement RN Navigator Cardiac Imaging [facility] [phone] office [phone] cell  Patient to take 100mg  metoprolol tartrate two hours prior to cardiac CT scan.  Patient does report some claustrophobia in a CT scanner.

## 2021-06-27 ENCOUNTER — Other Ambulatory Visit: Payer: Self-pay

## 2021-06-27 ENCOUNTER — Ambulatory Visit (HOSPITAL_COMMUNITY)
Admission: RE | Admit: 2021-06-27 | Discharge: 2021-06-27 | Disposition: A | Discharge status: Home / Self Care | Payer: Medicare Other | Source: Ambulatory Visit | Attending: Internal Medicine | Admitting: Internal Medicine

## 2021-06-27 DIAGNOSIS — R0789 Other chest pain: Secondary | ICD-10-CM | POA: Insufficient documentation

## 2021-06-27 DIAGNOSIS — R0602 Shortness of breath: Secondary | ICD-10-CM | POA: Diagnosis not present

## 2021-06-27 DIAGNOSIS — I517 Cardiomegaly: Secondary | ICD-10-CM | POA: Insufficient documentation

## 2021-06-27 DIAGNOSIS — Z006 Encounter for examination for normal comparison and control in clinical research program: Secondary | ICD-10-CM

## 2021-06-27 DIAGNOSIS — I2510 Atherosclerotic heart disease of native coronary artery without angina pectoris: Secondary | ICD-10-CM | POA: Insufficient documentation

## 2021-06-27 MED ORDER — NITROGLYCERIN 0.4 MG SL SUBL
SUBLINGUAL_TABLET | SUBLINGUAL | Status: AC
  Filled 2021-06-27: qty 2

## 2021-06-27 MED ORDER — NITROGLYCERIN 0.4 MG SL SUBL
0.8000 mg | SUBLINGUAL_TABLET | Freq: Once | SUBLINGUAL | Status: AC
  Administered 2021-06-27: 0.8 mg via SUBLINGUAL

## 2021-06-27 MED ORDER — IOHEXOL 350 MG/ML SOLN
95.0000 mL | Freq: Once | INTRAVENOUS | Status: AC | PRN
  Administered 2021-06-27: 95 mL via INTRAVENOUS

## 2021-06-27 NOTE — Research (Signed)
IDENTIFY Informed Consent                  Subject Name:Jacob [name]   Subject met inclusion and exclusion criteria.  The informed consent form, study requirements and expectations were reviewed with the subject and questions and concerns were addressed prior to the signing of the consent form.  The subject verbalized understanding of the trial requirements.  The subject agreed to participate in the IDENTIFY trial and signed the informed consent at 7:30am on 06/27/21.  The informed consent was obtained prior to performance of any protocol-specific procedures for the subject.  A copy of the signed informed consent was given to the subject and a copy was placed in the subject's medical record.    Marylou Mccoy, Research Coordinator

## 2021-06-28 DIAGNOSIS — G894 Chronic pain syndrome: Secondary | ICD-10-CM | POA: Diagnosis not present

## 2021-06-28 DIAGNOSIS — M545 Low back pain, unspecified: Secondary | ICD-10-CM | POA: Diagnosis not present

## 2021-06-29 ENCOUNTER — Telehealth: Payer: Self-pay | Admitting: *Deleted

## 2021-06-29 MED ORDER — ASPIRIN EC 81 MG PO TBEC: 81.0000 mg | DELAYED_RELEASE_TABLET | Freq: Every day | ORAL | 3 refills | Status: AC

## 2021-06-29 NOTE — Telephone Encounter (Signed)
Pt notified of test results and to start an 81 mg ASA daily.

## 2021-06-29 NOTE — Telephone Encounter (Signed)
-----   Message from Rodman Key, RN sent at 06/29/2021  3:37 PM EDT ----- Linna Hoff patient.

## 2021-07-10 ENCOUNTER — Other Ambulatory Visit: Payer: Self-pay

## 2021-07-10 ENCOUNTER — Ambulatory Visit (HOSPITAL_COMMUNITY)
Admission: RE | Admit: 2021-07-10 | Discharge: 2021-07-10 | Disposition: A | Discharge status: Home / Self Care | Payer: Medicare Other | Source: Ambulatory Visit | Attending: Internal Medicine | Admitting: Internal Medicine

## 2021-07-10 DIAGNOSIS — R0602 Shortness of breath: Secondary | ICD-10-CM

## 2021-07-10 LAB — ECHOCARDIOGRAM COMPLETE
Area-P 1/2: 3.08 cm2
P 1/2 time: 458 msec
S' Lateral: 3.2 cm

## 2021-07-10 NOTE — Progress Notes (Signed)
*  PRELIMINARY RESULTS* Echocardiogram 2D Echocardiogram has been performed.  Samuel Germany 07/10/2021, 10:00 AM

## 2021-07-12 DIAGNOSIS — Z23 Encounter for immunization: Secondary | ICD-10-CM | POA: Diagnosis not present

## 2021-07-19 ENCOUNTER — Telehealth: Payer: Self-pay | Admitting: Internal Medicine

## 2021-07-19 NOTE — Telephone Encounter (Signed)
Left a v.mail for patient to call office back.

## 2021-07-19 NOTE — Telephone Encounter (Signed)
We are recommending the COVID-19 vaccine/booster to all of our patients.

## 2021-07-19 NOTE — Telephone Encounter (Signed)
Spoke to pt who verbalized understanding and will proceed to get Covid booster on 10/21.

## 2021-07-19 NOTE — Telephone Encounter (Signed)
New Message:      Patient says he is scheduled for his Covid Booster on Friday(07-21-21). He wants to know if he should take the booster, because hie have been having s chest  pain on the left side. He said it is not  having chest pain. at this time.       Pt c/o of Chest Pain: STAT if CP now or developed within 24 hours  1. Are you having CP right now? Not at this timest  2. Are you experiencing any other symptoms (ex. SOB, nausea, vomiting, sweating)? no  3. How long have you been experiencing CP? Off and on for 2 to 3 months, but started back hurting yesterday  4. Is your CP continuous or coming and going? comes and goes  5. Have you taken Nitroglycerin? no ?

## 2021-07-25 DIAGNOSIS — K5730 Diverticulosis of large intestine without perforation or abscess without bleeding: Secondary | ICD-10-CM | POA: Diagnosis not present

## 2021-07-25 DIAGNOSIS — K648 Other hemorrhoids: Secondary | ICD-10-CM | POA: Diagnosis not present

## 2021-07-25 DIAGNOSIS — Z86010 Personal history of colonic polyps: Secondary | ICD-10-CM | POA: Diagnosis not present

## 2021-07-25 DIAGNOSIS — D123 Benign neoplasm of transverse colon: Secondary | ICD-10-CM | POA: Diagnosis not present

## 2021-07-26 DIAGNOSIS — G894 Chronic pain syndrome: Secondary | ICD-10-CM | POA: Diagnosis not present

## 2021-07-26 DIAGNOSIS — M545 Low back pain, unspecified: Secondary | ICD-10-CM | POA: Diagnosis not present

## 2021-07-28 DIAGNOSIS — K219 Gastro-esophageal reflux disease without esophagitis: Secondary | ICD-10-CM | POA: Diagnosis not present

## 2021-07-28 DIAGNOSIS — E7800 Pure hypercholesterolemia, unspecified: Secondary | ICD-10-CM | POA: Diagnosis not present

## 2021-07-28 DIAGNOSIS — J45909 Unspecified asthma, uncomplicated: Secondary | ICD-10-CM | POA: Diagnosis not present

## 2021-07-28 DIAGNOSIS — G8929 Other chronic pain: Secondary | ICD-10-CM | POA: Diagnosis not present

## 2021-07-28 DIAGNOSIS — G43009 Migraine without aura, not intractable, without status migrainosus: Secondary | ICD-10-CM | POA: Diagnosis not present

## 2021-07-28 DIAGNOSIS — D123 Benign neoplasm of transverse colon: Secondary | ICD-10-CM | POA: Diagnosis not present

## 2021-07-28 DIAGNOSIS — I10 Essential (primary) hypertension: Secondary | ICD-10-CM | POA: Diagnosis not present

## 2021-08-06 NOTE — Progress Notes (Signed)
Cardiology Office Note   Date:  08/06/2021   ID:  Jacob [name] 1950/12/26, MRN [id number]  PCP:  Jacob Low, [name]  Cardiologist:   Jacob [name]   Patient presents for evaluation of SOB     History of Present Illness: Jacob [name] is a 70 y.o. male with a history of HL, atypical CP and dyspnea on exertoin.   He was seen by P Nahser in 2017  Prior to tha by Derl Barrow  A nuclear stress test was done back in 2013 which was negative for ischemia    Patient says that sometimes when walks back and forth across the room gets exhausted   Not all the time though   Does walk some but not a lot   No regular exercise    LImtted by back problems    The pt does have CP     These episodes can occur with and without activity   Occurs 2 to 3 x per week Resolve on own  Does note his heart beating harder at times   Not faster No dizziness  I saw the pt in Sept 2022  The pt says he hs been feeling pretty good   Only a couple times SOB   Does get some pain in chest   Gets every other day    Plus minus activity.  Lasts 30 min to 1 hour   No outpatient medications have been marked as taking for the 08/07/21 encounter (Appointment) with Fay Records, [name].     Allergies:   Patient has no known allergies.   Past Medical History:  Diagnosis Date   Chronic headaches    GERD (gastroesophageal reflux disease)    History of kidney stones    x1 episode   Hypercholesteremia    Hypercholesterolemia    Lumbar radiculopathy    lower back pain- both legs has numbness varies   Otitis media    Skin cancer of nose     Past Surgical History:  Procedure Laterality Date   BACK SURGERY     laminectomy-lumbar x3   COLONOSCOPY WITH PROPOFOL N/A 11/28/2015   Procedure: COLONOSCOPY WITH PROPOFOL;  Surgeon: Garlan Fair, [name];  Location: [facility];  Service: Endoscopy;  Laterality: N/A;   INGUINAL HERNIA REPAIR Right 05/18/2013   Procedure: RIGHT INGUINAL HERNIA REPAIR ADULT;  Surgeon: Scherry Ran, [name];  Location: [facility];  Service: General;  Laterality: Right;  (no mesh)   SKIN CANCER EXCISION     nose   VASECTOMY       Social History:  The patient  reports that he quit smoking about 38 years ago. His smoking use included cigarettes. He has a 5.00 pack-year smoking history. He has never used smokeless tobacco. He reports that he does not drink alcohol and does not use drugs.   Family History:  The patient's family history includes CAD in his mother.    ROS:  Please see the history of present illness. All other systems are reviewed and  Negative to the above problem except as noted.    PHYSICAL EXAM: VS:  There were no vitals taken for this visit.  GEN: Well nourished, well developed, in no acute distress  HEENT: normal  Neck: no JVD, No bruits  Cardiac: RRR; no murmurs,  No LE  edema  Respiratory:  clear to auscultation  GI: soft, nontender, nondistended, + BS  No hepatomegaly  MS: no deformity Moving all extremities  Skin: warm and dry, no rash Neuro:  Strength and sensation are intact Psych: euthymic mood, full affect   EKG:  EKG is ordered today.  SR  71 bpm   Nnspecific ST T wave cchanges    Lipid Panel No results found for: CHOL, TRIG, HDL, CHOLHDL, VLDL, LDLCALC, LDLDIRECT    Wt Readings from Last 3 Encounters:  06/16/21 196 lb 6.4 oz (89.1 kg)  07/15/18 190 lb (86.2 kg)  04/12/16 199 lb 12.8 oz (90.6 kg)      ASSESSMENT AND PLAN 1  CAD  Pt with signficant atherosclerosis but no limiting leasions  His symptom=ms are a little atypical   Would try [name] dose Imdur 30 mg   Continue other meds    Follow   May have more microvascular dz leading to symptoms   Symptoms are a little atypcial though  2  DOE   Pt walks some   No Changes    3  HL   Will get lipids from Dr Lysle Rubens   Need aggressive control   4  HTN  BP controlled    F/U later in winter    Current medicines are reviewed at length with the patient today.  The patient does not have concerns  regarding medicines.  Signed, Jacob [name]  08/06/2021 4:05 PM    [facility] [place], [place], [place]  [address] Phone: [phone]; Fax: [phone]

## 2021-08-07 ENCOUNTER — Telehealth: Payer: Self-pay | Admitting: Internal Medicine

## 2021-08-07 ENCOUNTER — Other Ambulatory Visit: Payer: Self-pay

## 2021-08-07 ENCOUNTER — Ambulatory Visit: Payer: Medicare Other | Admitting: Internal Medicine

## 2021-08-07 ENCOUNTER — Encounter: Payer: Self-pay | Admitting: Internal Medicine

## 2021-08-07 VITALS — BP 128/64 | HR 78 | Ht 70.0 in | Wt 197.0 lb

## 2021-08-07 DIAGNOSIS — I2510 Atherosclerotic heart disease of native coronary artery without angina pectoris: Secondary | ICD-10-CM

## 2021-08-07 DIAGNOSIS — Z13220 Encounter for screening for lipoid disorders: Secondary | ICD-10-CM

## 2021-08-07 MED ORDER — ISOSORBIDE MONONITRATE ER 30 MG PO TB24: 30.0000 mg | ORAL_TABLET | Freq: Every day | ORAL | 3 refills | Status: DC

## 2021-08-07 NOTE — Patient Instructions (Signed)
Medication Instructions:    Imdur 30 mg Daily   *If you need a refill on your cardiac medications before your next appointment, please call your pharmacy*   Lab Work: NONE   If you have labs (blood work) drawn today and your tests are completely normal, you will receive your results only by: Ramos (if you have MyChart) OR A paper copy in the mail If you have any lab test that is abnormal or we need to change your treatment, we will call you to review the results.   Testing/Procedures: NONE    Follow-Up: At [facility], you and your health needs are our priority.  As part of our continuing mission to provide you with exceptional heart care, we have created designated Provider Care Teams.  These Care Teams include your primary Cardiologist (physician) and Advanced Practice Providers (APPs -  Physician Assistants and Nurse Practitioners) who all work together to provide you with the care you need, when you need it.  We recommend signing up for the patient portal called "MyChart".  Sign up information is provided on this After Visit Summary.  MyChart is used to connect with patients for Virtual Visits (Telemedicine).  Patients are able to view lab/test results, encounter notes, upcoming appointments, etc.  Non-urgent messages can be sent to your provider as well.   To learn more about what you can do with MyChart, go to [email or web address].    Your next appointment:    January   The format for your next appointment:   In Person  Provider:   Dorris Carnes, MD    Other Instructions Thank you for choosing [facility]!

## 2021-08-07 NOTE — Telephone Encounter (Signed)
Patient had lipids done in July   LDL 86  Needs to be lower    ON Lipitor 40    I would recomm adding Zetia to regimen to see if LDL can go below 70 CHeck Lipomed, Lp(a) , apo B in 8 wks

## 2021-08-08 ENCOUNTER — Encounter: Payer: Self-pay | Admitting: *Deleted

## 2021-08-08 MED ORDER — EZETIMIBE 10 MG PO TABS: 10.0000 mg | ORAL_TABLET | Freq: Every day | ORAL | 3 refills | Status: DC

## 2021-08-08 NOTE — Addendum Note (Signed)
Addended by: Levonne Hubert on: 08/08/2021 08:42 AM   Modules accepted: Orders

## 2021-08-08 NOTE — Telephone Encounter (Signed)
Pt notified and agrees to add Zetia and have labwork done in 8 weeks.

## 2021-08-23 DIAGNOSIS — M545 Low back pain, unspecified: Secondary | ICD-10-CM | POA: Diagnosis not present

## 2021-08-23 DIAGNOSIS — G894 Chronic pain syndrome: Secondary | ICD-10-CM | POA: Diagnosis not present

## 2021-09-01 ENCOUNTER — Telehealth: Payer: Self-pay

## 2021-09-01 DIAGNOSIS — Z13220 Encounter for screening for lipoid disorders: Secondary | ICD-10-CM

## 2021-09-01 DIAGNOSIS — E7849 Other hyperlipidemia: Secondary | ICD-10-CM

## 2021-09-01 DIAGNOSIS — I2510 Atherosclerotic heart disease of native coronary artery without angina pectoris: Secondary | ICD-10-CM

## 2021-09-01 DIAGNOSIS — Z79899 Other long term (current) drug therapy: Secondary | ICD-10-CM

## 2021-09-01 NOTE — Telephone Encounter (Signed)
Pt advised his lab results and agrees to talk with the PharmD about possibly using Repatha... he says he can come to [facility] if he has to but would like to keep his care in [place] if possible or even have a phone call with the Pharmacist instead.   Will place the order.

## 2021-09-01 NOTE — Telephone Encounter (Signed)
-----   Message from Fay Records, MD sent at 08/30/2021  1:33 PM EST ----- Labs from July LDL 86   Pt with atherosclerosis  SHould be lower   ON lipitor and Zetia I would refer for consideratoin of Repatha

## 2021-09-05 DIAGNOSIS — I10 Essential (primary) hypertension: Secondary | ICD-10-CM | POA: Diagnosis not present

## 2021-09-05 DIAGNOSIS — J45909 Unspecified asthma, uncomplicated: Secondary | ICD-10-CM | POA: Diagnosis not present

## 2021-09-05 DIAGNOSIS — J309 Allergic rhinitis, unspecified: Secondary | ICD-10-CM | POA: Diagnosis not present

## 2021-09-05 DIAGNOSIS — E7800 Pure hypercholesterolemia, unspecified: Secondary | ICD-10-CM | POA: Diagnosis not present

## 2021-09-05 DIAGNOSIS — I2510 Atherosclerotic heart disease of native coronary artery without angina pectoris: Secondary | ICD-10-CM | POA: Diagnosis not present

## 2021-09-05 DIAGNOSIS — Z5181 Encounter for therapeutic drug level monitoring: Secondary | ICD-10-CM | POA: Diagnosis not present

## 2021-09-20 DIAGNOSIS — M545 Low back pain, unspecified: Secondary | ICD-10-CM | POA: Diagnosis not present

## 2021-09-20 DIAGNOSIS — G894 Chronic pain syndrome: Secondary | ICD-10-CM | POA: Diagnosis not present

## 2021-10-03 ENCOUNTER — Other Ambulatory Visit (HOSPITAL_COMMUNITY)
Admission: RE | Admit: 2021-10-03 | Discharge: 2021-10-03 | Disposition: A | Discharge status: Home / Self Care | Payer: Medicare Other | Source: Ambulatory Visit | Attending: Internal Medicine | Admitting: Internal Medicine

## 2021-10-03 ENCOUNTER — Other Ambulatory Visit: Payer: Self-pay

## 2021-10-03 DIAGNOSIS — E785 Hyperlipidemia, unspecified: Secondary | ICD-10-CM | POA: Diagnosis not present

## 2021-10-03 DIAGNOSIS — Z13220 Encounter for screening for lipoid disorders: Secondary | ICD-10-CM | POA: Insufficient documentation

## 2021-10-04 ENCOUNTER — Ambulatory Visit: Payer: Medicare Other

## 2021-10-04 ENCOUNTER — Telehealth: Payer: Self-pay | Admitting: Pharmacist

## 2021-10-04 LAB — MISC LABCORP TEST (SEND OUT): Labcorp test code: 167015

## 2021-10-04 NOTE — Progress Notes (Deleted)
Patient ID: Jacob [name]                 DOB: 1951-09-10                    MRN: [id number]     HPI: Jacob [name] is a 71 y.o. male patient referred to lipid clinic by Dr. Harrington Challenger. PMH is significant for HL, atypical CP and dyspnea on exertoin  Current Medications: atorvastatin 40mg  daily, ezetimibe 10mg  daily Intolerances:  Risk Factors:  LDL goal:   Diet:   Exercise:   Family History:  The patient's family history includes CAD in his mother.   Social History: The patient  reports that he quit smoking about 38 years ago. His smoking use included cigarettes. He has a 5.00 pack-year smoking history. He has never used smokeless tobacco. He reports that he does not drink alcohol and does not use drugs  Labs: 04/06/21 TC 185m TG 71, HDL 51, LDL-C 86  Past Medical History:  Diagnosis Date   Chronic headaches    GERD (gastroesophageal reflux disease)    History of kidney stones    x1 episode   Hypercholesteremia    Hypercholesterolemia    Lumbar radiculopathy    lower back pain- both legs has numbness varies   Otitis media    Skin cancer of nose     Current Outpatient Medications on File Prior to Visit  Medication Sig Dispense Refill   albuterol (PROVENTIL HFA;VENTOLIN HFA) 108 (90 Base) MCG/ACT inhaler Inhale 1-2 puffs into the lungs every 6 (six) hours as needed for wheezing or shortness of breath. 1 Inhaler 0   amLODipine (NORVASC) 5 MG tablet Take 1 tablet (5 mg total) by mouth daily. 90 tablet 3   aspirin EC 81 MG tablet Take 1 tablet (81 mg total) by mouth daily. Swallow whole. 90 tablet 3   atorvastatin (LIPITOR) 40 MG tablet Take 40 mg by mouth daily.     docusate sodium (COLACE) 100 MG capsule Take 100 mg by mouth 2 (two) times daily as needed for mild constipation.     esomeprazole (NEXIUM) 40 MG capsule Take 1 capsule by mouth as needed (gerd).      ezetimibe (ZETIA) 10 MG tablet Take 1 tablet (10 mg total) by mouth daily. 90 tablet 3   fluticasone (FLONASE) 50 MCG/ACT  nasal spray SHAKE LQ AND U 2 SPRAYS IEN QD  11   HYDROcodone-acetaminophen (NORCO) 10-325 MG tablet Take 1 tablet by mouth every 4 (four) hours as needed. pain  0   isosorbide mononitrate (IMDUR) 30 MG 24 hr tablet Take 1 tablet (30 mg total) by mouth daily. 90 tablet 3   meloxicam (MOBIC) 15 MG tablet Take 15 mg by mouth as needed.      metoprolol tartrate (LOPRESSOR) 100 MG tablet Take 1 tablet (100 mg total) by mouth as directed. Take two hours prior to CT Scan 1 tablet 0   oxyCODONE (OXYCONTIN) 20 MG 12 hr tablet Take 20 mg by mouth every 12 (twelve) hours.     promethazine (PHENERGAN) 25 MG tablet Take 25-50 mg by mouth every 8 (eight) hours as needed. nausea  5   No current facility-administered medications on file prior to visit.    No Known Allergies  Assessment/Plan:  1. Hyperlipidemia -    Thank you,   Ramond Dial, Pharm.D, BCPS, CPP [facility]  [address], [place], [place] [address]  Phone: [phone];  Fax: [phone]

## 2021-10-04 NOTE — Telephone Encounter (Signed)
Upon reviewing patients most recent labs from Sep 05, 2021 LDL-C was 67. His ApoB on 10/03/21 was 64. Lipids are at goal.  Patient on atorvastatin 40mg  daily and ezetimibe 10mg  daily. I called and spoke with patient. Advised him to continue current medications. Offered to keep apt today if he had anything to discuss. Patient wished to cancel.

## 2021-10-09 ENCOUNTER — Telehealth: Payer: Self-pay | Admitting: Internal Medicine

## 2021-10-09 NOTE — Telephone Encounter (Signed)
No call placed from [employer].

## 2021-10-09 NOTE — Telephone Encounter (Signed)
Pt is returning call from this morning to triage

## 2021-10-09 NOTE — Telephone Encounter (Signed)
Unable to locate where call came from that he saw on his caller ID... no note in the chart. The pt reports that no message was left. He says he is doing well and confirmed his 11/03/21 appt with Dr. Harrington Challenger in [place] with him.   Pt to call if he needs anything prior to then.

## 2021-10-17 DIAGNOSIS — M545 Low back pain, unspecified: Secondary | ICD-10-CM | POA: Diagnosis not present

## 2021-10-17 DIAGNOSIS — K219 Gastro-esophageal reflux disease without esophagitis: Secondary | ICD-10-CM | POA: Diagnosis not present

## 2021-10-17 DIAGNOSIS — E7800 Pure hypercholesterolemia, unspecified: Secondary | ICD-10-CM | POA: Diagnosis not present

## 2021-10-17 DIAGNOSIS — G894 Chronic pain syndrome: Secondary | ICD-10-CM | POA: Diagnosis not present

## 2021-10-17 DIAGNOSIS — G43009 Migraine without aura, not intractable, without status migrainosus: Secondary | ICD-10-CM | POA: Diagnosis not present

## 2021-10-17 DIAGNOSIS — G8929 Other chronic pain: Secondary | ICD-10-CM | POA: Diagnosis not present

## 2021-10-17 DIAGNOSIS — I10 Essential (primary) hypertension: Secondary | ICD-10-CM | POA: Diagnosis not present

## 2021-11-02 NOTE — Progress Notes (Deleted)
Cardiology Office Note   Date:  11/02/2021   ID:  Jacob, [name] 1951/02/26, MRN [id number]  PCP:  Jacob Low, MD  Cardiologist:   Jacob Carnes, MD   Patient presents for evaluation of SOB     History of Present Illness: Jacob [name] is a 71 y.o. male with a history of HL, atypical CP and dyspnea on exertoin.   He was seen by Jacob [name] in 2017  Prior to tha by Jacob [name]  A nuclear stress test was done back in 2013 which was negative for ischemia    Patient says that sometimes when walks back and forth across the room gets exhausted   Not all the time though   Does walk some but not a lot   No regular exercise    LImtted by back problems    The pt does have CP     These episodes can occur with and without activity   Occurs 2 to 3 x per week Resolve on own  Does note his heart beating harder at times   Not faster No dizziness  I saw the pt in Sept 2022  The pt says he hs been feeling pretty good   Only a couple times SOB   Does get some pain in chest   Gets every other day    Plus minus activity.  Lasts 30 min to 1 hour   I saw the pt in Nov 2022  No outpatient medications have been marked as taking for the 11/03/21 encounter (Appointment) with Jacob Records, MD.     Allergies:   Patient has no known allergies.   Past Medical History:  Diagnosis Date   Chronic headaches    GERD (gastroesophageal reflux disease)    History of kidney stones    x1 episode   Hypercholesteremia    Hypercholesterolemia    Lumbar radiculopathy    lower back pain- both legs has numbness varies   Otitis media    Skin cancer of nose     Past Surgical History:  Procedure Laterality Date   BACK SURGERY     laminectomy-lumbar x3   COLONOSCOPY WITH PROPOFOL N/A 11/28/2015   Procedure: COLONOSCOPY WITH PROPOFOL;  Surgeon: Jacob Fair, MD;  Location: [facility];  Service: Endoscopy;  Laterality: N/A;   INGUINAL HERNIA REPAIR Right 05/18/2013   Procedure: RIGHT INGUINAL HERNIA REPAIR  ADULT;  Surgeon: Jacob Ran, MD;  Location: [facility];  Service: General;  Laterality: Right;  (no mesh)   SKIN CANCER EXCISION     nose   VASECTOMY       Social History:  The patient  reports that he quit smoking about 38 years ago. His smoking use included cigarettes. He has a 5.00 pack-year smoking history. He has never used smokeless tobacco. He reports that he does not drink alcohol and does not use drugs.   Family History:  The patient's family history includes CAD in his mother.    ROS:  Please see the history of present illness. All other systems are reviewed and  Negative to the above problem except as noted.    PHYSICAL EXAM: VS:  There were no vitals taken for this visit.  GEN: Well nourished, well developed, in no acute distress  HEENT: normal  Neck: no JVD, No bruits  Cardiac: RRR; no murmurs,  No LE  edema  Respiratory:  clear to auscultation  GI: soft, nontender, nondistended, + BS  No hepatomegaly  MS: no deformity Moving all extremities   Skin: warm and dry, no rash Neuro:  Strength and sensation are intact Psych: euthymic mood, full affect   EKG:  EKG is ordered today.  SR  71 bpm   Nnspecific ST T wave cchanges    Lipid Panel No results found for: CHOL, TRIG, HDL, CHOLHDL, VLDL, LDLCALC, LDLDIRECT    Wt Readings from Last 3 Encounters:  08/07/21 197 lb (89.4 kg)  06/16/21 196 lb 6.4 oz (89.1 kg)  07/15/18 190 lb (86.2 kg)      ASSESSMENT AND PLAN 1  CAD  Pt with signficant atherosclerosis but no limiting leasions  His symptom=ms are a little atypical   Would try [name] dose Imdur 30 mg   Continue other meds    Follow   May have more microvascular dz leading to symptoms   Symptoms are a little atypcial though  2  DOE   Pt walks some   No Changes    3  HL   Will get lipids from Jacob [name]   Need aggressive control   4  HTN  BP controlled    F/U later in winter    Current medicines are reviewed at length with the patient today.  The patient does  not have concerns regarding medicines.  Signed, Jacob Carnes, MD  11/02/2021 11:51 PM    [facility] [place], [place], [place]  [address] Phone: [phone]; Fax: [phone]

## 2021-11-03 ENCOUNTER — Ambulatory Visit: Payer: Medicare Other | Admitting: Internal Medicine

## 2021-11-14 DIAGNOSIS — M545 Low back pain, unspecified: Secondary | ICD-10-CM | POA: Diagnosis not present

## 2021-11-14 DIAGNOSIS — Z79891 Long term (current) use of opiate analgesic: Secondary | ICD-10-CM | POA: Diagnosis not present

## 2021-11-14 DIAGNOSIS — G894 Chronic pain syndrome: Secondary | ICD-10-CM | POA: Diagnosis not present

## 2021-11-14 DIAGNOSIS — Z79899 Other long term (current) drug therapy: Secondary | ICD-10-CM | POA: Diagnosis not present

## 2021-11-27 DIAGNOSIS — L570 Actinic keratosis: Secondary | ICD-10-CM | POA: Diagnosis not present

## 2021-11-27 DIAGNOSIS — L814 Other melanin hyperpigmentation: Secondary | ICD-10-CM | POA: Diagnosis not present

## 2021-11-27 DIAGNOSIS — L821 Other seborrheic keratosis: Secondary | ICD-10-CM | POA: Diagnosis not present

## 2021-11-27 DIAGNOSIS — L812 Freckles: Secondary | ICD-10-CM | POA: Diagnosis not present

## 2021-11-27 DIAGNOSIS — D225 Melanocytic nevi of trunk: Secondary | ICD-10-CM | POA: Diagnosis not present

## 2021-11-27 DIAGNOSIS — L918 Other hypertrophic disorders of the skin: Secondary | ICD-10-CM | POA: Diagnosis not present

## 2021-12-19 NOTE — Progress Notes (Signed)
? ?Cardiology Office Note ? ? ?Date:  12/20/2021  ? ?ID:  Jacob [name], DOB Jan 31, 1951, MRN [id number] ? ?PCP:  Wenda Low, MD  ?Cardiologist:   Dorris Carnes, MD  ? ?Pt presents for f/u of CAD  ? ?History of Present Illness: ?Jacob [name] is a 71 y.o. Jacob with a history of HL, atypical CP and dyspnea on exertion   Myoview in 2019 was normal    I saw him in Sept 2022   Went on to have a CT coronary angio that showed Ca score of approx 1800   Mild to mod CAD on CT   ? ? ?I saw the pt in clinic in Nov 2022 ? ?Pt says he gets SOB occasionally   Most of the time fine     ?Occasionally sharp pains    ?Most of the time his breathing is fine   ? ?BP at home is 120s to 140s    ?Occasional dizziness with standing    ? ?Diet:    ?Breakfast:   Bacon, sausage (Home made) eggs biscuit  Coffee ?Lunch    Sandwich   PB J   Drink water or coffee ?Dinner    Lawyer, hamburger, potatoes   Water or dinner ?Snack   Oatmeal cookie ? ?Current Meds  ?Medication Sig  ? albuterol (PROVENTIL HFA;VENTOLIN HFA) 108 (90 Base) MCG/ACT inhaler Inhale 1-2 puffs into the lungs every 6 (six) hours as needed for wheezing or shortness of breath.  ? amLODipine (NORVASC) 5 MG tablet Take 1 tablet (5 mg total) by mouth daily.  ? aspirin EC 81 MG tablet Take 1 tablet (81 mg total) by mouth daily. Swallow whole.  ? atorvastatin (LIPITOR) 40 MG tablet Take 40 mg by mouth daily.  ? docusate sodium (COLACE) 100 MG capsule Take 100 mg by mouth 2 (two) times daily as needed for mild constipation.  ? esomeprazole (NEXIUM) 40 MG capsule Take 1 capsule by mouth as needed (gerd).   ? ezetimibe (ZETIA) 10 MG tablet Take 1 tablet (10 mg total) by mouth daily.  ? fluticasone (FLONASE) 50 MCG/ACT nasal spray SHAKE LQ AND U 2 SPRAYS IEN QD  ? HYDROcodone-acetaminophen (NORCO) 10-325 MG tablet Take 1 tablet by mouth every 4 (four) hours as needed. pain  ? isosorbide mononitrate (IMDUR) 30 MG 24 hr tablet Take 1 tablet (30 mg total) by mouth daily.  ? meloxicam  (MOBIC) 15 MG tablet Take 15 mg by mouth as needed.   ? metoprolol tartrate (LOPRESSOR) 100 MG tablet Take 1 tablet (100 mg total) by mouth as directed. Take two hours prior to CT Scan  ? oxyCODONE (OXYCONTIN) 20 MG 12 hr tablet Take 20 mg by mouth every 12 (twelve) hours.  ? promethazine (PHENERGAN) 25 MG tablet Take 25-50 mg by mouth every 8 (eight) hours as needed. nausea  ? ? ? ?Allergies:   Patient has no known allergies.  ? ?Past Medical History:  ?Diagnosis Date  ? Chronic headaches   ? GERD (gastroesophageal reflux disease)   ? History of kidney stones   ? x1 episode  ? Hypercholesteremia   ? Hypercholesterolemia   ? Lumbar radiculopathy   ? lower back pain- both legs has numbness varies  ? Otitis media   ? Skin cancer of nose   ? ? ?Past Surgical History:  ?Procedure Laterality Date  ? BACK SURGERY    ? laminectomy-lumbar x3  ? COLONOSCOPY WITH PROPOFOL N/A 11/28/2015  ? Procedure: COLONOSCOPY WITH PROPOFOL;  Surgeon: Garlan Fair, MD;  Location: [facility];  Service: Endoscopy;  Laterality: N/A;  ? INGUINAL HERNIA REPAIR Right 05/18/2013  ? Procedure: RIGHT INGUINAL HERNIA REPAIR ADULT;  Surgeon: Scherry Ran, MD;  Location: [facility];  Service: General;  Laterality: Right;  (no mesh)  ? SKIN CANCER EXCISION    ? nose  ? VASECTOMY    ? ? ? ?Social History:  The patient  reports that he quit smoking about 38 years ago. His smoking use included cigarettes. He has a 5.00 pack-year smoking history. He has never used smokeless tobacco. He reports that he does not drink alcohol and does not use drugs.  ? ?Family History:  The patient's family history includes CAD in his mother.  ? ? ?ROS:  Please see the history of present illness. All other systems are reviewed and  Negative to the above problem except as noted.  ? ? ?PHYSICAL EXAM: ?VS:  BP 140/80   Pulse 70   Ht '5\' 10"'$  (1.778 m)   Wt 200 lb (90.7 kg)   SpO2 98%   BMI 28.70 kg/m?   ?GEN: Well nourished, well developed, in no acute distress  ?HEENT:  normal  ?Neck: no JVD, No bruits  ?Cardiac: RRR; no murmurs,  No lower extremity edema  ?Respiratory:  clear to auscultation  ?GI: soft, nontender, nondistended, + BS  No hepatomegaly  ?MS: no deformity Moving all extremities   ?Skin: warm and dry, no rash ?Neuro:  Strength and sensation are intact ?Psych: euthymic mood, full affect ? ? ?EKG:  EKG is not ordered today.  ? ?CT coronary angiogram    Sept 2022 ? ?IMPRESSION: ?1. Coronary calcium score of 1807. This was 95th percentile for age ?and sex matched control. ?  ?2. Normal coronary origin with right dominance. ?  ?3. Nonobstructive CAD ?  ?4. Calcified plaque in the left main causes minimal (0-24%) stenosis ?  ?5. Mixed plaque in the proximal LAD causes mild (25-49%) stenosis, ?with high risk plaque features including positive remodeling, low ?attenuation plaque and napkin ring sign. There is calcified plaque ?in the mid LAD causing minimal (0-24%) stenosis. There is calcified ?plaque in ostial D1 causing mild (25-49%) stenosis. ?  ?6. Calcified plaque caused mild (25-49%) stenosis in the proximal ?and mid RCA, and causes minimal (0-24%) stenosis in the distal RCA. ?  ?7. Calcified plaque in the proximal LCX causes minimal (0-24%) ?stenosis ?  ?8. Dilated ascending aorta measuring 26m ?  ?CAD-RADS 2. Mild non-obstructive CAD (25-49%). Consider ?non-atherosclerotic causes of chest pain. Consider preventive ?therapy and risk factor modification. ?  ?Echo October 2022 ? ?Left ventricular ejection fraction, by estimation, is 60 to 65%. The left ventricle has ?normal function. The left ventricle has no regional wall motion abnormalities. There is ?mild left ventricular hypertrophy. Left ventricular diastolic parameters were normal. ?1. ?Right ventricular systolic function is normal. The right ventricular size is normal. ?Tricuspid regurgitation signal is inadequate for assessing PA pressure. ?2. ?3. Left atrial size was mildly dilated. ?4. Right atrial size was  mildly dilated. ?The mitral valve is normal in structure. No evidence of mitral valve regurgitation. No ?evidence of mitral stenosis. ?5. ?The aortic valve is tricuspid. Aortic valve regurgitation is mild to moderate. No aortic ?stenosis is present. ?6. ?7. Aortic dilatation noted. There is dilatation of the aortic root, measuring 41 mm. ?The inferior vena cava is normal in size with greater than 50% respiratory variability, ?suggesting right atrial pressure of 3 mmHg. ?  ?  Lipid Panel ?No results found for: CHOL, TRIG, HDL, CHOLHDL, VLDL, LDLCALC, LDLDIRECT ?  ? ?Wt Readings from Last 3 Encounters:  ?12/20/21 200 lb (90.7 kg)  ?08/07/21 197 lb (89.4 kg)  ?06/16/21 196 lb 6.4 oz (89.1 kg)  ?  ? ? ?ASSESSMENT AND PLAN ?1  CAD  OVerall I think the pt is doing well   I am not convinced of symptoms    Continue meds/risk factor modif. ? ?2  DOE   Erratic    Most of the time no SOB    Follow   I am not convinced cardiac  ? ?3  HL   Lipids are controlled   LDL 67  ApoB low   Continue  ? ?4  HTN  BP is a little higher than it should be    Increase amlodiopine to 7.5 daily   Then 10 mg    if needed    Pt to adjust   Follows at home  ? ?5  Diet   Discussed   Whole, nonprocessed food  Limit carbs  ? ?F/U later in Oct/ Nov ? ? ?Current medicines are reviewed at length with the patient today.  The patient does not have concerns regarding medicines. ? ?Signed, ?Dorris Carnes, MD  ?12/20/2021 8:44 AM    ?[facility] ?[address], [place], [place]  [address] ?Phone: [phone]; Fax: [phone]  ? ? ?

## 2021-12-20 ENCOUNTER — Other Ambulatory Visit: Payer: Self-pay

## 2021-12-20 ENCOUNTER — Ambulatory Visit: Payer: Medicare Other | Admitting: Internal Medicine

## 2021-12-20 ENCOUNTER — Encounter: Payer: Self-pay | Admitting: Internal Medicine

## 2021-12-20 VITALS — BP 140/80 | HR 70 | Ht 70.0 in | Wt 200.0 lb

## 2021-12-20 DIAGNOSIS — I2510 Atherosclerotic heart disease of native coronary artery without angina pectoris: Secondary | ICD-10-CM

## 2021-12-20 MED ORDER — AMLODIPINE BESYLATE 5 MG PO TABS: 7.5000 mg | ORAL_TABLET | Freq: Every day | ORAL | 3 refills | Status: DC

## 2021-12-20 NOTE — Patient Instructions (Signed)
Medication Instructions:  ? ?Increase Norvasc to 7.5 mg Daily  ? ?*If you need a refill on your cardiac medications before your next appointment, please call your pharmacy* ? ? ?Lab Work: ?NONE  ? ?If you have labs (blood work) drawn today and your tests are completely normal, you will receive your results only by: ?MyChart Message (if you have MyChart) OR ?A paper copy in the mail ?If you have any lab test that is abnormal or we need to change your treatment, we will call you to review the results. ? ? ?Testing/Procedures: ?NONE  ? ? ?Follow-Up: ?At [facility], you and your health needs are our priority.  As part of our continuing mission to provide you with exceptional heart care, we have created designated Provider Care Teams.  These Care Teams include your primary Cardiologist (physician) and Advanced Practice Providers (APPs -  Physician Assistants and Nurse Practitioners) who all work together to provide you with the care you need, when you need it. ? ?We recommend signing up for the patient portal called "MyChart".  Sign up information is provided on this After Visit Summary.  MyChart is used to connect with patients for Virtual Visits (Telemedicine).  Patients are able to view lab/test results, encounter notes, upcoming appointments, etc.  Non-urgent messages can be sent to your provider as well.   ?To learn more about what you can do with MyChart, go to [email or web address].   ? ?Your next appointment:   ? October Garnet Koyanagi  ? ?The format for your next appointment:   ?In Person ? ?Provider:   ?Dorris Carnes, MD  ? ? ?Other Instructions ?Thank you for choosing [facility]! ?  ? ? ?

## 2021-12-26 DIAGNOSIS — G894 Chronic pain syndrome: Secondary | ICD-10-CM | POA: Diagnosis not present

## 2021-12-26 DIAGNOSIS — M545 Low back pain, unspecified: Secondary | ICD-10-CM | POA: Diagnosis not present

## 2022-01-23 DIAGNOSIS — M545 Low back pain, unspecified: Secondary | ICD-10-CM | POA: Diagnosis not present

## 2022-01-23 DIAGNOSIS — G894 Chronic pain syndrome: Secondary | ICD-10-CM | POA: Diagnosis not present

## 2022-02-06 DIAGNOSIS — M549 Dorsalgia, unspecified: Secondary | ICD-10-CM | POA: Diagnosis not present

## 2022-02-06 DIAGNOSIS — R609 Edema, unspecified: Secondary | ICD-10-CM | POA: Diagnosis not present

## 2022-02-08 ENCOUNTER — Other Ambulatory Visit: Payer: Self-pay | Admitting: Internal Medicine

## 2022-02-08 DIAGNOSIS — H35372 Puckering of macula, left eye: Secondary | ICD-10-CM | POA: Diagnosis not present

## 2022-02-08 DIAGNOSIS — H25812 Combined forms of age-related cataract, left eye: Secondary | ICD-10-CM | POA: Diagnosis not present

## 2022-02-08 DIAGNOSIS — R609 Edema, unspecified: Secondary | ICD-10-CM

## 2022-02-08 DIAGNOSIS — H26491 Other secondary cataract, right eye: Secondary | ICD-10-CM | POA: Diagnosis not present

## 2022-02-08 DIAGNOSIS — H35033 Hypertensive retinopathy, bilateral: Secondary | ICD-10-CM | POA: Diagnosis not present

## 2022-02-20 ENCOUNTER — Ambulatory Visit
Admission: RE | Admit: 2022-02-20 | Discharge: 2022-02-20 | Disposition: A | Discharge status: Home / Self Care | Payer: Medicare Other | Source: Ambulatory Visit | Attending: Internal Medicine | Admitting: Internal Medicine

## 2022-02-20 DIAGNOSIS — G894 Chronic pain syndrome: Secondary | ICD-10-CM | POA: Diagnosis not present

## 2022-02-20 DIAGNOSIS — M545 Low back pain, unspecified: Secondary | ICD-10-CM | POA: Diagnosis not present

## 2022-02-20 DIAGNOSIS — R609 Edema, unspecified: Secondary | ICD-10-CM

## 2022-02-20 DIAGNOSIS — R1909 Other intra-abdominal and pelvic swelling, mass and lump: Secondary | ICD-10-CM | POA: Diagnosis not present

## 2022-02-21 ENCOUNTER — Other Ambulatory Visit: Payer: Medicare Other

## 2022-02-22 ENCOUNTER — Other Ambulatory Visit: Payer: Self-pay | Admitting: Internal Medicine

## 2022-02-23 ENCOUNTER — Other Ambulatory Visit: Payer: Self-pay | Admitting: Internal Medicine

## 2022-02-23 DIAGNOSIS — M7989 Other specified soft tissue disorders: Secondary | ICD-10-CM

## 2022-02-27 ENCOUNTER — Other Ambulatory Visit: Payer: Self-pay | Admitting: Internal Medicine

## 2022-02-27 DIAGNOSIS — M7989 Other specified soft tissue disorders: Secondary | ICD-10-CM

## 2022-03-03 ENCOUNTER — Telehealth: Payer: Self-pay | Admitting: Student

## 2022-03-03 NOTE — Telephone Encounter (Signed)
   Patient called the Answering Service with concerns about his BP. He states BP is usually well controlled with systolic BP in the 128F but he states over the last week systolic BP has been in the 170s. He takes Amlodipine 7.'5mg'$  daily and Imdur '30mg'$  daily. Systolic BP this morning was 174 around the time he took his medications. He states he has been feeling a little tired and has had some mild dizziness but no pain anywhere or other stroke like symptoms. Recommended increasing Amlodipine to '10mg'$  daily to start. Asked him to keep a BP/HR log for 2 weeks and then send this to our office. Recommended checking BP 2-3 hours after taking medications. Suspect additional changes may need to be made. Patient voiced understanding and agreed.  Darreld Mclean, PA-C 03/03/2022 7:33 AM

## 2022-03-05 DIAGNOSIS — H26491 Other secondary cataract, right eye: Secondary | ICD-10-CM | POA: Diagnosis not present

## 2022-03-05 DIAGNOSIS — H2512 Age-related nuclear cataract, left eye: Secondary | ICD-10-CM | POA: Diagnosis not present

## 2022-03-06 ENCOUNTER — Other Ambulatory Visit: Payer: Self-pay | Admitting: Physician Assistant

## 2022-03-06 ENCOUNTER — Telehealth: Payer: Self-pay | Admitting: Internal Medicine

## 2022-03-06 MED ORDER — AMLODIPINE BESYLATE 10 MG PO TABS: 10.0000 mg | ORAL_TABLET | Freq: Every day | ORAL | 3 refills | Status: DC

## 2022-03-06 NOTE — Telephone Encounter (Signed)
Pt c/o BP issue: STAT if pt c/o blurred vision, one-sided weakness or slurred speech  1. What are your last 5 BP readings? 137/69, 129/60, 03-04-22-  124/59 heart rate  52, 136/60 heart rate 76    118/60 heart rate 60, 138/64 heart rate 71  132/64 heart rate 61, 132/58 heart rate 68, 145/68 heart rate 73 137/67 heart rate 64  today 129/60 heart rate 60 142/70 heart rate 87  2. Are you having any other symptoms (ex. Dizziness, headache, blurred vision, passed out)?  A little dizziness and sometimes blurred vision- no symptoms at this exact time  3. What is your BP issue? Blood pressure is running high for him

## 2022-03-06 NOTE — Progress Notes (Signed)
Pt is concerned about his BP.  He thought the diastolic BP was supposed to be in the 20s.   His BP readings show a SBP range 118 - 145 DBP generally about 60 HR in the 50s at times.  Not currently having any sx.   Reviewed normal BP range and his meds.  Will continue the amlodipine at 10 mg qd, rx sent in. Explained that he is at max dose of the amlodipine and metoprolol. For better BP control, would have to add a med. Do not think that is necessary at this time.   He will continue to track his BP, report consistent reading > 982 systolic. Otherwise bring records to f/u appts w/ PCP or Cards.  He agrees w/ this plan.   Rosaria Ferries, PA-C 03/06/2022 2:33 PM

## 2022-03-07 ENCOUNTER — Telehealth: Payer: Self-pay | Admitting: *Deleted

## 2022-03-07 NOTE — Telephone Encounter (Signed)
   Pre-operative Risk Assessment    Patient Name: Jacob [name]  DOB: 04-Jan-1951 MRN: [id number]      Request for Surgical Clearance    Procedure:   CATARACT EXTRACTION BY PE,IOL-LEFT   Date of Surgery:  Clearance TBD                                 Surgeon:  DR. Tama High Surgeon's Group or Practice Name:  [facility] Phone number:  [phone] EXT [phone] ATTN: Rinaldo Cloud, NP Fax number:  [phone]   Type of Clearance Requested:   - Medical ; PER CLEARANCE REQUEST NO MEDICATIONS ARE NEEDING TO BE HELD   Type of Anesthesia:   IV SEDATION   Additional requests/questions:    Jacob [name]   03/07/2022, 4:36 PM

## 2022-03-08 NOTE — Telephone Encounter (Signed)
   Patient Name: Jacob [name]  DOB: 04/28/1951 MRN: [id number]  Primary Cardiologist: Dorris Carnes, MD  Chart reviewed as part of pre-operative protocol coverage. Cataract extractions are recognized in guidelines as low risk surgeries that do not typically require specific preoperative testing or holding of blood thinner therapy. Therefore, given past medical history and time since last visit, based on ACC/AHA guidelines, J[facility]AM [facility] would be at acceptable risk for the planned procedure without further cardiovascular testing.   I will route this recommendation to the requesting party via Epic fax function and remove from pre-op pool.  Please call with questions.  Lenna Sciara, NP 03/08/2022, 4:42 PM

## 2022-03-19 ENCOUNTER — Ambulatory Visit
Admission: RE | Admit: 2022-03-19 | Discharge: 2022-03-19 | Disposition: A | Discharge status: Home / Self Care | Payer: Medicare Other | Source: Ambulatory Visit | Attending: Internal Medicine | Admitting: Internal Medicine

## 2022-03-19 DIAGNOSIS — M7989 Other specified soft tissue disorders: Secondary | ICD-10-CM

## 2022-03-19 DIAGNOSIS — K7689 Other specified diseases of liver: Secondary | ICD-10-CM | POA: Diagnosis not present

## 2022-03-19 DIAGNOSIS — K5730 Diverticulosis of large intestine without perforation or abscess without bleeding: Secondary | ICD-10-CM | POA: Diagnosis not present

## 2022-03-19 MED ORDER — GADOBENATE DIMEGLUMINE 529 MG/ML IV SOLN
19.0000 mL | Freq: Once | INTRAVENOUS | Status: AC | PRN
  Administered 2022-03-19: 19 mL via INTRAVENOUS

## 2022-03-23 DIAGNOSIS — H269 Unspecified cataract: Secondary | ICD-10-CM | POA: Diagnosis not present

## 2022-03-23 DIAGNOSIS — H2512 Age-related nuclear cataract, left eye: Secondary | ICD-10-CM | POA: Diagnosis not present

## 2022-03-23 DIAGNOSIS — Z01818 Encounter for other preprocedural examination: Secondary | ICD-10-CM | POA: Diagnosis not present

## 2022-04-04 DIAGNOSIS — M545 Low back pain, unspecified: Secondary | ICD-10-CM | POA: Diagnosis not present

## 2022-04-04 DIAGNOSIS — G894 Chronic pain syndrome: Secondary | ICD-10-CM | POA: Diagnosis not present

## 2022-04-16 DIAGNOSIS — Z1389 Encounter for screening for other disorder: Secondary | ICD-10-CM | POA: Diagnosis not present

## 2022-04-16 DIAGNOSIS — I10 Essential (primary) hypertension: Secondary | ICD-10-CM | POA: Diagnosis not present

## 2022-04-16 DIAGNOSIS — E7800 Pure hypercholesterolemia, unspecified: Secondary | ICD-10-CM | POA: Diagnosis not present

## 2022-04-16 DIAGNOSIS — G8929 Other chronic pain: Secondary | ICD-10-CM | POA: Diagnosis not present

## 2022-04-16 DIAGNOSIS — I2510 Atherosclerotic heart disease of native coronary artery without angina pectoris: Secondary | ICD-10-CM | POA: Diagnosis not present

## 2022-04-16 DIAGNOSIS — K219 Gastro-esophageal reflux disease without esophagitis: Secondary | ICD-10-CM | POA: Diagnosis not present

## 2022-04-16 DIAGNOSIS — R7309 Other abnormal glucose: Secondary | ICD-10-CM | POA: Diagnosis not present

## 2022-04-16 DIAGNOSIS — J45909 Unspecified asthma, uncomplicated: Secondary | ICD-10-CM | POA: Diagnosis not present

## 2022-04-16 DIAGNOSIS — G43009 Migraine without aura, not intractable, without status migrainosus: Secondary | ICD-10-CM | POA: Diagnosis not present

## 2022-04-16 DIAGNOSIS — Z0000 Encounter for general adult medical examination without abnormal findings: Secondary | ICD-10-CM | POA: Diagnosis not present

## 2022-04-16 DIAGNOSIS — J309 Allergic rhinitis, unspecified: Secondary | ICD-10-CM | POA: Diagnosis not present

## 2022-04-30 DIAGNOSIS — M545 Low back pain, unspecified: Secondary | ICD-10-CM | POA: Diagnosis not present

## 2022-04-30 DIAGNOSIS — G894 Chronic pain syndrome: Secondary | ICD-10-CM | POA: Diagnosis not present

## 2022-05-28 NOTE — Progress Notes (Unsigned)
Cardiology Office Note:    Date:  05/29/2022   ID:  Jacob [name], DOB September 03, 1951, MRN [id number]  PCP:  Wenda Low, MD   [facility] Providers Cardiologist:  Dorris Carnes, MD     Referring MD: Wenda Low, MD   Chief Complaint: leg swelling  History of Present Illness:    Jacob [name] is a very pleasant 71 y.o. male with a hx of HTN, to moderate CAD on CT, hyperlipidemia, mild dilatation of aortic root, atypical chest pain, and DOE.  Myoview in 2019 was normal. Coronary CA score 1807 on CCTA 06/2021. Echo 07/2021 showed normal LVEF 60 to 65%, no RWMA, mild LVH, normal RV, mild bilateral atrial dilatation, mild to moderate AI, aortic root dilatation 41 mm.   He was last seen in our office on 12/20/21 by Dr. Harrington Challenger. She felt that occasional symptoms of chest pain and DOE were erratic and not specifically concerning for angina. BP was mildly elevated and amlodipine was increased to 7.5 mg daily.  He called our office 03/03/2022 with concerns for elevated BP systolic readings in the 417E.  Was advised to increase amlodipine to 10 mg daily and send in BP readings in 2 weeks.  Today, he is here alone today for evaluation of bilateral leg swelling x a few weeks, equal on both sides. Feels more fatigued recently that he feels coincides with timing of swelling onset. Had shortness of breath on one occasion and occasionally feels left sided chest pain.  Additionally, notes burning in the center of his chest on occasion that does not always occur after eating. Woke up sweating during the night 3 times during past few weeks and has occasional nausea. Has a lipoma on back left side of his waistband that on occasion causes pants to be tighter. Recent home SBP 118-140, mostly < 140. Admits to some indiscretion with sodium, eats fried foods and eats breakfast meat (sausage, bacon) 3 days per week. Occasional leg cramps.  Was previously walking consistently for exercise, but has not done that as much  recently due to leg swelling. He denies orthopnea, PND, presyncope, and syncope.   Past Medical History:  Diagnosis Date   Chronic headaches    GERD (gastroesophageal reflux disease)    History of kidney stones    x1 episode   Hypercholesteremia    Hypercholesterolemia    Lumbar radiculopathy    lower back pain- both legs has numbness varies   Otitis media    Skin cancer of nose     Past Surgical History:  Procedure Laterality Date   BACK SURGERY     laminectomy-lumbar x3   COLONOSCOPY WITH PROPOFOL N/A 11/28/2015   Procedure: COLONOSCOPY WITH PROPOFOL;  Surgeon: Garlan Fair, MD;  Location: [facility];  Service: Endoscopy;  Laterality: N/A;   INGUINAL HERNIA REPAIR Right 05/18/2013   Procedure: RIGHT INGUINAL HERNIA REPAIR ADULT;  Surgeon: Scherry Ran, MD;  Location: [facility];  Service: General;  Laterality: Right;  (no mesh)   SKIN CANCER EXCISION     nose   VASECTOMY      Current Medications: Current Meds  Medication Sig   albuterol (PROVENTIL HFA;VENTOLIN HFA) 108 (90 Base) MCG/ACT inhaler Inhale 1-2 puffs into the lungs every 6 (six) hours as needed for wheezing or shortness of breath.   aspirin EC 81 MG tablet Take 1 tablet (81 mg total) by mouth daily. Swallow whole.   atorvastatin (LIPITOR) 40 MG tablet Take 40 mg by mouth daily.  docusate sodium (COLACE) 100 MG capsule Take 100 mg by mouth 2 (two) times daily as needed for mild constipation.   esomeprazole (NEXIUM) 40 MG capsule Take 1 capsule by mouth as needed (gerd).    ezetimibe (ZETIA) 10 MG tablet Take 1 tablet (10 mg total) by mouth daily.   fluticasone (FLONASE) 50 MCG/ACT nasal spray SHAKE LQ AND U 2 SPRAYS IEN QD   HYDROcodone-acetaminophen (NORCO) 10-325 MG tablet Take 1 tablet by mouth every 4 (four) hours as needed. pain   isosorbide mononitrate (IMDUR) 30 MG 24 hr tablet Take 1 tablet (30 mg total) by mouth daily.   meloxicam (MOBIC) 15 MG tablet Take 15 mg by mouth as needed.    oxyCODONE  (OXYCONTIN) 20 MG 12 hr tablet Take 20 mg by mouth every 12 (twelve) hours.   promethazine (PHENERGAN) 25 MG tablet Take 25-50 mg by mouth every 8 (eight) hours as needed. nausea   valsartan (DIOVAN) 40 MG tablet Take 1 tablet (40 mg total) by mouth daily.   [DISCONTINUED] amLODipine (NORVASC) 10 MG tablet Take 1 tablet (10 mg total) by mouth daily.     Allergies:   Patient has no known allergies.   Social History   Socioeconomic History   Marital status: Married    Spouse name: Not on file   Number of children: Not on file   Years of education: Not on file   Highest education level: Not on file  Occupational History   Not on file  Tobacco Use   Smoking status: Former    Packs/day: 1.00    Years: 5.00    Total pack years: 5.00    Types: Cigarettes    Quit date: 05/15/1983    Years since quitting: 39.0   Smokeless tobacco: Never  Vaping Use   Vaping Use: Never used  Substance and Sexual Activity   Alcohol use: No   Drug use: No   Sexual activity: Yes    Birth control/protection: None  Other Topics Concern   Not on file  Social History Narrative   Not on file   Social Determinants of Health   Financial Resource Strain: Not on file  Food Insecurity: Not on file  Transportation Needs: Not on file  Physical Activity: Not on file  Stress: Not on file  Social Connections: Not on file     Family History: The patient's family history includes CAD in his mother.  ROS:   Please see the history of present illness.    + bilateral leg swelling + occasional chest pain All other systems reviewed and are negative.  Labs/Other Studies Reviewed:    The following studies were reviewed today:  Echo 07/10/21  1. Left ventricular ejection fraction, by estimation, is 60 to 65%. The  left ventricle has normal function. The left ventricle has no regional  wall motion abnormalities. There is mild left ventricular hypertrophy.  Left ventricular diastolic parameters  were  normal.   2. Right ventricular systolic function is normal. The right ventricular  size is normal. Tricuspid regurgitation signal is inadequate for assessing  PA pressure.   3. Left atrial size was mildly dilated.   4. Right atrial size was mildly dilated.   5. The mitral valve is normal in structure. No evidence of mitral valve  regurgitation. No evidence of mitral stenosis.   6. The aortic valve is tricuspid. Aortic valve regurgitation is mild to  moderate. No aortic stenosis is present.   7. Aortic dilatation noted. There is  dilatation of the aortic root,  measuring 41 mm.   8. The inferior vena cava is normal in size with greater than 50%  respiratory variability, suggesting right atrial pressure of 3 mmHg.   Coronary CTA 06/27/21  IMPRESSION: 1. Coronary calcium score of 1807. This was 95th percentile for age and sex matched control.   2. Normal coronary origin with right dominance.   3. Nonobstructive CAD   4. Calcified plaque in the left main causes minimal (0-24%) stenosis   5. Mixed plaque in the proximal LAD causes mild (25-49%) stenosis, with high risk plaque features including positive remodeling, low attenuation plaque and napkin ring sign. There is calcified plaque in the mid LAD causing minimal (0-24%) stenosis. There is calcified plaque in ostial D1 causing mild (25-49%) stenosis.   6. Calcified plaque caused mild (25-49%) stenosis in the proximal and mid RCA, and causes minimal (0-24%) stenosis in the distal RCA.   7. Calcified plaque in the proximal LCX causes minimal (0-24%) stenosis   8. Dilated ascending aorta measuring 41m  Recent Labs: 06/16/2021: BUN 16; Creatinine, Ser 0.96; Potassium 4.0; Sodium 139  Recent Lipid Panel No results found for: "CHOL", "TRIG", "HDL", "CHOLHDL", "VLDL", "LDLCALC", "LDLDIRECT"   Risk Assessment/Calculations:      Physical Exam:    VS:  BP 130/72   Pulse 74   Ht '5\' 10"'$  (1.778 m)   Wt 199 lb 3.2 oz (90.4 kg)    SpO2 97%   BMI 28.58 kg/m     Wt Readings from Last 3 Encounters:  05/29/22 199 lb 3.2 oz (90.4 kg)  12/20/21 200 lb (90.7 kg)  08/07/21 197 lb (89.4 kg)     GEN:  Well nourished, well developed in no acute distress HEENT: Normal NECK: No JVD; No carotid bruits CARDIAC: RRR, no murmurs, rubs, gallops RESPIRATORY:  Clear to auscultation without rales, wheezing or rhonchi  ABDOMEN: Soft, non-tender, non-distended MUSCULOSKELETAL:  Non-pitting bilateral LE edema; No deformity. 2+ pedal pulses, equal bilaterally SKIN: Warm and dry NEUROLOGIC:  Alert and oriented x 3 PSYCHIATRIC:  Normal affect   EKG:  EKG is ordered today.  The ekg ordered today demonstrates NSR at 74 bpm, incomplete RBBB, nonspecific ST abnormality    Diagnoses:    1. Coronary artery disease of native artery of native heart with stable angina pectoris ([place]   2. Atypical chest pain   3. Medication management   4. Hyperlipidemia LDL goal <70   5. Essential hypertension   6. Aortic root dilatation (HCC)    Assessment and Plan:     Chest pain: Reports left-side chest pain and burning in chest on occasion. Also recently feeling more fatigued. Unclear as to whether this is angina. Has had symptoms for almost one year, worse recently. We will get Lexiscan Myoview to evaluate for ischemia.  Leg swelling: He is on high-dose amlodipine for hypertension. Will reduce amlodipine dose and add valsartan for BP control.   CAD with stable angina?: Coronary CTA 06/2021 showed mild to moderate CAD that did not appear to be flow-limiting. Calcium score > 1800. LDL well-controlled on current therapy. Chest pain has factors consistent with angina and also atypical factors. Also feeling fatigued. As noted above, we will get Lexiscan Myoview to evaluate for ischemia. Continue ezetimibe, Imdur, atorvastatin, amlodipine.   Hypertension: BP is well-controlled today. Home BP reportedly also well-controlled.  Due to leg swelling, we will  decrease amlodipine to 5 mg daily.  We will add valsartan 40 mg and check  bmet in 1 week. Advised him that this is lowest dose of valsartan and he will likely need a higher dose. Continue to monitor BP and report if consistently > 130 or > 80.   Aortic root dilatation: Dilated ascending aorta measuring 40 mm on CT 06/2021. Not specifically addressed today. Would favor repeat imaging (CT or MR) at next office visit.   Hyperlipidemia LDL goal < 70: LDL 60 on 04/16/22.  Continue Zetia, atorvastatin.   Shared Decision Making/Informed Consent The risks [chest pain, shortness of breath, cardiac arrhythmias, dizziness, blood pressure fluctuations, myocardial infarction, stroke/transient ischemic attack, nausea, vomiting, allergic reaction, radiation exposure, metallic taste sensation and life-threatening complications (estimated to be 1 in 10,000)], benefits (risk stratification, diagnosing coronary artery disease, treatment guidance) and alternatives of a nuclear stress test were discussed in detail with Mr. Deakins and he agrees to proceed.   Disposition: Keep your appointment with Dr. Harrington Challenger in 10 months  Medication Adjustments/Labs and Tests Ordered: Current medicines are reviewed at length with the patient today.  Concerns regarding medicines are outlined above.  Orders Placed This Encounter  Procedures   Basic Metabolic Panel (BMET)   Basic Metabolic Panel (BMET)   Basic Metabolic Panel (BMET)   Cardiac Stress Test: Informed Consent Details: Physician/Practitioner Attestation; Transcribe to consent form and obtain patient signature   Myocardial Perfusion Imaging   EKG 12-Lead   Meds ordered this encounter  Medications   amLODipine (NORVASC) 5 MG tablet    Sig: Take 1 tablet (5 mg total) by mouth daily.    Dispense:  90 tablet    Refill:  3   valsartan (DIOVAN) 40 MG tablet    Sig: Take 1 tablet (40 mg total) by mouth daily.    Dispense:  30 tablet    Refill:  11    Patient Instructions   Medication Instructions:   DECREASE Amlodipine one (1) tablet by mouth ( 5 mg ) daily.   START Valsartan one (1) tablet by mouth ( 40 mg ) daily.   *If you need a refill on your cardiac medications before your next appointment, please call your pharmacy*   Lab Work:  Your physician recommends that you return for lab work tomorrow.   Will give you paperwork today to get labs in [place] next week.    If you have labs (blood work) drawn today and your tests are completely normal, you will receive your results only by: [place] (if you have MyChart) OR A paper copy in the mail If you have any lab test that is abnormal or we need to change your treatment, we will call you to review the results.   Testing/Procedures:  You are scheduled for a Myocardial Perfusion Imaging Study on Wednesday, August 30 at 7:45 am.   Please arrive 15 minutes prior to your appointment time for registration and insurance purposes.   The test will take approximately 3 to 4 hours to complete; you may bring reading material. If someone comes with you to your appointment, they will need to remain in the main lobby due to limited space in the testing area.   How to prepare for your Myocardial Perfusion test:   Do not eat or drink 3 hours prior to your test, except you may have water.    Do not consume products containing caffeine (regular or decaffeinated) 12 hours prior to your test (ex: coffee, chocolate, soda, tea)   Do bring a list of your current medications with you. If not listed  below, you may take your medications as normal.    Bring any held medication to your appointment, as you may be required to take it once the test is complete.   Do wear comfortable clothes (no  overalls) and walking shoes. Tennis shoes are preferred. No open toed shoes.  Do not wear cologne, aftershave or lotions (deodorant is allowed).   If these instructions are not followed, you test will have to be  rescheduled.   Please report to [address] for your test. If you have questions or concerns about your appointment, please call the Nuclear Lab at [phone].  If you cannot keep your appointment, please provide 24 hour notification to the Nuclear lab to avoid a possible $50 charge to your account.       Follow-Up: At [facility], you and your health needs are our priority.  As part of our continuing mission to provide you with exceptional heart care, we have created designated Provider Care Teams.  These Care Teams include your primary Cardiologist (physician) and Advanced Practice Providers (APPs -  Physician Assistants and Nurse Practitioners) who all work together to provide you with the care you need, when you need it.  We recommend signing up for the patient portal called "MyChart".  Sign up information is provided on this After Visit Summary.  MyChart is used to connect with patients for Virtual Visits (Telemedicine).  Patients are able to view lab/test results, encounter notes, upcoming appointments, etc.  Non-urgent messages can be sent to your provider as well.   To learn more about what you can do with MyChart, go to [email or web address].    Your next appointment:   4 month(s)  The format for your next appointment:   In Person  Provider:   Dorris Carnes, MD     Other Instructions  HOW TO TAKE YOUR BLOOD PRESSURE: Rest 5 minutes before taking your blood pressure.  Don't smoke or drink caffeinated beverages for at least 30 minutes before. Take your blood pressure before (not after) you eat. Sit comfortably with your back supported and both feet on the floor (don't cross your legs). Elevate your arm to heart level on a table or a desk. Use the proper sized cuff. It should fit smoothly and snugly around your bare upper arm. There should be enough room to slip a fingertip under the cuff. The bottom edge of the cuff should be 1 inch above the  crease of the elbow. Please monitor your blood pressure and if your blood pressure consistently remains above 130 on the top or 80 on the bottom x3 please call our office at [phone] or send a MyChart message   [place] refers to food and lifestyle choices that are based on the traditions of countries located on the [employer]. It focuses on eating more fruits, vegetables, whole grains, beans, nuts, seeds, and heart-healthy fats, and eating less dairy, meat, eggs, and processed foods with added sugar, salt, and fat. This way of eating has been shown to help prevent certain conditions and improve outcomes for people who have chronic diseases, like kidney disease and heart disease. What are tips for following this plan? Reading food labels Check the serving size of packaged foods. For foods such as rice and pasta, the serving size refers to the amount of cooked product, not dry. Check the total fat in packaged foods. Avoid foods that have saturated fat or trans fats. Check the ingredient list  for added sugars, such as corn syrup. Shopping  Buy a variety of foods that offer a balanced diet, including: Fresh fruits and vegetables (produce). Grains, beans, nuts, and seeds. Some of these may be available in unpackaged forms or large amounts (in bulk). Fresh seafood. Poultry and eggs. Low-fat dairy products. Buy whole ingredients instead of prepackaged foods. Buy fresh fruits and vegetables in-season from local farmers markets. Buy plain frozen fruits and vegetables. If you do not have access to quality fresh seafood, buy precooked frozen shrimp or canned fish, such as tuna, salmon, or sardines. Stock your pantry so you always have certain foods on hand, such as olive oil, canned tuna, canned tomatoes, rice, pasta, and beans. Cooking Cook foods with extra-virgin olive oil instead of using butter or other vegetable oils. Have meat as a side dish, and have  vegetables or grains as your main dish. This means having meat in small portions or adding small amounts of meat to foods like pasta or stew. Use beans or vegetables instead of meat in common dishes like chili or lasagna. Experiment with different cooking methods. Try roasting, broiling, steaming, and sauting vegetables. Add frozen vegetables to soups, stews, pasta, or rice. Add nuts or seeds for added healthy fats and plant protein at each meal. You can add these to yogurt, salads, or vegetable dishes. Marinate fish or vegetables using olive oil, lemon juice, garlic, and fresh herbs. Meal planning Plan to eat one vegetarian meal one day each week. Try to work up to two vegetarian meals, if possible. Eat seafood two or more times a week. Have healthy snacks readily available, such as: Vegetable sticks with hummus. Greek yogurt. Fruit and nut trail mix. Eat balanced meals throughout the week. This includes: Fruit: 2-3 servings a day. Vegetables: 4-5 servings a day. Low-fat dairy: 2 servings a day. Fish, poultry, or lean meat: 1 serving a day. Beans and legumes: 2 or more servings a week. Nuts and seeds: 1-2 servings a day. Whole grains: 6-8 servings a day. Extra-virgin olive oil: 3-4 servings a day. Limit red meat and sweets to only a few servings a month. Lifestyle  Cook and eat meals together with your family, when possible. Drink enough fluid to keep your urine pale yellow. Be physically active every day. This includes: Aerobic exercise like running or swimming. Leisure activities like gardening, walking, or housework. Get 7-8 hours of sleep each night. If recommended by your health care provider, drink red wine in moderation. This means 1 glass a day for nonpregnant women and 2 glasses a day for men. A glass of wine equals 5 oz (150 mL). What foods should I eat? Fruits Apples. Apricots. Avocado. Berries. Bananas. Cherries. Dates. Figs. Grapes. Lemons. Melon. Oranges. Peaches.  Plums. Pomegranate. Vegetables Artichokes. Beets. Broccoli. Cabbage. Carrots. Eggplant. Green beans. Chard. Kale. Spinach. Onions. Leeks. Peas. Squash. Tomatoes. Peppers. Radishes. Grains Whole-grain pasta. Brown rice. Bulgur wheat. Polenta. Couscous. Whole-wheat bread. [place]. Meats and other proteins Beans. Almonds. Sunflower seeds. Pine nuts. Peanuts. [place]. Salmon. Scallops. Shrimp. [place]. Tilapia. Clams. Oysters. Eggs. Poultry without skin. Dairy Low-fat milk. Cheese. Greek yogurt. Fats and oils Extra-virgin olive oil. Avocado oil. Grapeseed oil. Beverages Water. Red wine. Herbal tea. Sweets and desserts Greek yogurt with honey. Baked apples. Poached pears. Trail mix. Seasonings and condiments Basil. Cilantro. Coriander. Cumin. Mint. Parsley. Sage. Rosemary. Tarragon. Garlic. Oregano. Thyme. Pepper. Balsamic vinegar. Tahini. Hummus. Tomato sauce. Olives. Mushrooms. The items listed above may not be a complete list of foods and beverages  you can eat. Contact a dietitian for more information. What foods should I limit? This is a list of foods that should be eaten rarely or only on special occasions. Fruits Fruit canned in syrup. Vegetables Deep-fried potatoes (french fries). Grains Prepackaged pasta or rice dishes. Prepackaged cereal with added sugar. Prepackaged snacks with added sugar. Meats and other proteins Beef. Pork. Lamb. Poultry with skin. Hot dogs. [place] Salines. Dairy Ice cream. Sour cream. Whole milk. Fats and oils Butter. Canola oil. Vegetable oil. Beef fat (tallow). Lard. Beverages Juice. Sugar-sweetened soft drinks. Beer. Liquor and spirits. Sweets and desserts Cookies. Cakes. Pies. Candy. Seasonings and condiments Mayonnaise. Pre-made sauces and marinades. The items listed above may not be a complete list of foods and beverages you should limit. Contact a dietitian for more information. Summary The Mediterranean diet includes both food and lifestyle  choices. Eat a variety of fresh fruits and vegetables, beans, nuts, seeds, and whole grains. Limit the amount of red meat and sweets that you eat. If recommended by your health care provider, drink red wine in moderation. This means 1 glass a day for nonpregnant women and 2 glasses a day for men. A glass of wine equals 5 oz (150 mL). This information is not intended to replace advice given to you by your health care provider. Make sure you discuss any questions you have with your health care provider. Document Revised: 10/23/2019 Document Reviewed: 08/20/2019 Elsevier Patient Education  [place]         Signed, Emmaline Life, NP  05/29/2022 3:49 PM    [place]

## 2022-05-29 ENCOUNTER — Ambulatory Visit: Payer: Medicare Other | Attending: Nurse Practitioner | Admitting: Nurse Practitioner

## 2022-05-29 ENCOUNTER — Encounter: Payer: Self-pay | Admitting: Nurse Practitioner

## 2022-05-29 ENCOUNTER — Other Ambulatory Visit: Payer: Self-pay | Admitting: *Deleted

## 2022-05-29 ENCOUNTER — Telehealth (HOSPITAL_COMMUNITY): Payer: Self-pay | Admitting: *Deleted

## 2022-05-29 VITALS — BP 130/72 | HR 74 | Ht 70.0 in | Wt 199.2 lb

## 2022-05-29 DIAGNOSIS — R0789 Other chest pain: Secondary | ICD-10-CM

## 2022-05-29 DIAGNOSIS — I10 Essential (primary) hypertension: Secondary | ICD-10-CM

## 2022-05-29 DIAGNOSIS — G894 Chronic pain syndrome: Secondary | ICD-10-CM | POA: Diagnosis not present

## 2022-05-29 DIAGNOSIS — I25118 Atherosclerotic heart disease of native coronary artery with other forms of angina pectoris: Secondary | ICD-10-CM | POA: Diagnosis not present

## 2022-05-29 DIAGNOSIS — Z79899 Other long term (current) drug therapy: Secondary | ICD-10-CM

## 2022-05-29 DIAGNOSIS — E785 Hyperlipidemia, unspecified: Secondary | ICD-10-CM | POA: Diagnosis not present

## 2022-05-29 DIAGNOSIS — I77810 Thoracic aortic ectasia: Secondary | ICD-10-CM | POA: Diagnosis not present

## 2022-05-29 DIAGNOSIS — Z79891 Long term (current) use of opiate analgesic: Secondary | ICD-10-CM | POA: Diagnosis not present

## 2022-05-29 DIAGNOSIS — I2510 Atherosclerotic heart disease of native coronary artery without angina pectoris: Secondary | ICD-10-CM

## 2022-05-29 MED ORDER — VALSARTAN 40 MG PO TABS: 40.0000 mg | ORAL_TABLET | Freq: Every day | ORAL | 11 refills | Status: DC

## 2022-05-29 MED ORDER — AMLODIPINE BESYLATE 5 MG PO TABS: 5.0000 mg | ORAL_TABLET | Freq: Every day | ORAL | 3 refills | Status: DC

## 2022-05-29 NOTE — Telephone Encounter (Signed)
Patient given detailed instructions per Myocardial Perfusion Study Information Sheet for the test on 05/30/2022 at 7:45. Patient notified to arrive 15 minutes early and that it is imperative to arrive on time for appointment to keep from having the test rescheduled.  If you need to cancel or reschedule your appointment, please call the office within 24 hours of your appointment. . Patient verbalized understanding.Jacob [name]

## 2022-05-29 NOTE — Patient Instructions (Signed)
Medication Instructions:   DECREASE Amlodipine one (1) tablet by mouth ( 5 mg ) daily.   START Valsartan one (1) tablet by mouth ( 40 mg ) daily.   *If you need a refill on your cardiac medications before your next appointment, please call your pharmacy*   Lab Work:  Your physician recommends that you return for lab work tomorrow.   Will give you paperwork today to get labs in [place] next week.    If you have labs (blood work) drawn today and your tests are completely normal, you will receive your results only by: [place] (if you have MyChart) OR A paper copy in the mail If you have any lab test that is abnormal or we need to change your treatment, we will call you to review the results.   Testing/Procedures:  You are scheduled for a Myocardial Perfusion Imaging Study on Wednesday, August 30 at 7:45 am.   Please arrive 15 minutes prior to your appointment time for registration and insurance purposes.   The test will take approximately 3 to 4 hours to complete; you may bring reading material. If someone comes with you to your appointment, they will need to remain in the main lobby due to limited space in the testing area.   How to prepare for your Myocardial Perfusion test:   Do not eat or drink 3 hours prior to your test, except you may have water.    Do not consume products containing caffeine (regular or decaffeinated) 12 hours prior to your test (ex: coffee, chocolate, soda, tea)   Do bring a list of your current medications with you. If not listed below, you may take your medications as normal.    Bring any held medication to your appointment, as you may be required to take it once the test is complete.   Do wear comfortable clothes (no  overalls) and walking shoes. Tennis shoes are preferred. No open toed shoes.  Do not wear cologne, aftershave or lotions (deodorant is allowed).   If these instructions are not followed, you test will have to be  rescheduled.   Please report to [address] for your test. If you have questions or concerns about your appointment, please call the Nuclear Lab at [phone].  If you cannot keep your appointment, please provide 24 hour notification to the Nuclear lab to avoid a possible $50 charge to your account.       Follow-Up: At [facility], you and your health needs are our priority.  As part of our continuing mission to provide you with exceptional heart care, we have created designated Provider Care Teams.  These Care Teams include your primary Cardiologist (physician) and Advanced Practice Providers (APPs -  Physician Assistants and Nurse Practitioners) who all work together to provide you with the care you need, when you need it.  We recommend signing up for the patient portal called "MyChart".  Sign up information is provided on this After Visit Summary.  MyChart is used to connect with patients for Virtual Visits (Telemedicine).  Patients are able to view lab/test results, encounter notes, upcoming appointments, etc.  Non-urgent messages can be sent to your provider as well.   To learn more about what you can do with MyChart, go to [email or web address].    Your next appointment:   4 month(s)  The format for your next appointment:   In Person  Provider:   Dorris Carnes, MD     Other  Instructions  HOW TO TAKE YOUR BLOOD PRESSURE: Rest 5 minutes before taking your blood pressure.  Don't smoke or drink caffeinated beverages for at least 30 minutes before. Take your blood pressure before (not after) you eat. Sit comfortably with your back supported and both feet on the floor (don't cross your legs). Elevate your arm to heart level on a table or a desk. Use the proper sized cuff. It should fit smoothly and snugly around your bare upper arm. There should be enough room to slip a fingertip under the cuff. The bottom edge of the cuff should be 1 inch above the  crease of the elbow. Please monitor your blood pressure and if your blood pressure consistently remains above 130 on the top or 80 on the bottom x3 please call our office at [phone] or send a MyChart message   [place] refers to food and lifestyle choices that are based on the traditions of countries located on the [employer]. It focuses on eating more fruits, vegetables, whole grains, beans, nuts, seeds, and heart-healthy fats, and eating less dairy, meat, eggs, and processed foods with added sugar, salt, and fat. This way of eating has been shown to help prevent certain conditions and improve outcomes for people who have chronic diseases, like kidney disease and heart disease. What are tips for following this plan? Reading food labels Check the serving size of packaged foods. For foods such as rice and pasta, the serving size refers to the amount of cooked product, not dry. Check the total fat in packaged foods. Avoid foods that have saturated fat or trans fats. Check the ingredient list for added sugars, such as corn syrup. Shopping  Buy a variety of foods that offer a balanced diet, including: Fresh fruits and vegetables (produce). Grains, beans, nuts, and seeds. Some of these may be available in unpackaged forms or large amounts (in bulk). Fresh seafood. Poultry and eggs. Low-fat dairy products. Buy whole ingredients instead of prepackaged foods. Buy fresh fruits and vegetables in-season from local farmers markets. Buy plain frozen fruits and vegetables. If you do not have access to quality fresh seafood, buy precooked frozen shrimp or canned fish, such as tuna, salmon, or sardines. Stock your pantry so you always have certain foods on hand, such as olive oil, canned tuna, canned tomatoes, rice, pasta, and beans. Cooking Cook foods with extra-virgin olive oil instead of using butter or other vegetable oils. Have meat as a side dish, and have  vegetables or grains as your main dish. This means having meat in small portions or adding small amounts of meat to foods like pasta or stew. Use beans or vegetables instead of meat in common dishes like chili or lasagna. Experiment with different cooking methods. Try roasting, broiling, steaming, and sauting vegetables. Add frozen vegetables to soups, stews, pasta, or rice. Add nuts or seeds for added healthy fats and plant protein at each meal. You can add these to yogurt, salads, or vegetable dishes. Marinate fish or vegetables using olive oil, lemon juice, garlic, and fresh herbs. Meal planning Plan to eat one vegetarian meal one day each week. Try to work up to two vegetarian meals, if possible. Eat seafood two or more times a week. Have healthy snacks readily available, such as: Vegetable sticks with hummus. Greek yogurt. Fruit and nut trail mix. Eat balanced meals throughout the week. This includes: Fruit: 2-3 servings a day. Vegetables: 4-5 servings a day. Low-fat dairy: 2 servings a day. Fish,  poultry, or lean meat: 1 serving a day. Beans and legumes: 2 or more servings a week. Nuts and seeds: 1-2 servings a day. Whole grains: 6-8 servings a day. Extra-virgin olive oil: 3-4 servings a day. Limit red meat and sweets to only a few servings a month. Lifestyle  Cook and eat meals together with your family, when possible. Drink enough fluid to keep your urine pale yellow. Be physically active every day. This includes: Aerobic exercise like running or swimming. Leisure activities like gardening, walking, or housework. Get 7-8 hours of sleep each night. If recommended by your health care provider, drink red wine in moderation. This means 1 glass a day for nonpregnant women and 2 glasses a day for men. A glass of wine equals 5 oz (150 mL). What foods should I eat? Fruits Apples. Apricots. Avocado. Berries. Bananas. Cherries. Dates. Figs. Grapes. Lemons. Melon. Oranges. Peaches.  Plums. Pomegranate. Vegetables Artichokes. Beets. Broccoli. Cabbage. Carrots. Eggplant. Green beans. Chard. Kale. Spinach. Onions. Leeks. Peas. Squash. Tomatoes. Peppers. Radishes. Grains Whole-grain pasta. Brown rice. Bulgur wheat. Polenta. Couscous. Whole-wheat bread. [place]. Meats and other proteins Beans. Almonds. Sunflower seeds. Pine nuts. Peanuts. [place]. Salmon. Scallops. Shrimp. [place]. Tilapia. Clams. Oysters. Eggs. Poultry without skin. Dairy Low-fat milk. Cheese. Greek yogurt. Fats and oils Extra-virgin olive oil. Avocado oil. Grapeseed oil. Beverages Water. Red wine. Herbal tea. Sweets and desserts Greek yogurt with honey. Baked apples. Poached pears. Trail mix. Seasonings and condiments Basil. Cilantro. Coriander. Cumin. Mint. Parsley. Sage. Rosemary. Tarragon. Garlic. Oregano. Thyme. Pepper. Balsamic vinegar. Tahini. Hummus. Tomato sauce. Olives. Mushrooms. The items listed above may not be a complete list of foods and beverages you can eat. Contact a dietitian for more information. What foods should I limit? This is a list of foods that should be eaten rarely or only on special occasions. Fruits Fruit canned in syrup. Vegetables Deep-fried potatoes (french fries). Grains Prepackaged pasta or rice dishes. Prepackaged cereal with added sugar. Prepackaged snacks with added sugar. Meats and other proteins Beef. Pork. Lamb. Poultry with skin. Hot dogs. [place] Salines. Dairy Ice cream. Sour cream. Whole milk. Fats and oils Butter. Canola oil. Vegetable oil. Beef fat (tallow). Lard. Beverages Juice. Sugar-sweetened soft drinks. Beer. Liquor and spirits. Sweets and desserts Cookies. Cakes. Pies. Candy. Seasonings and condiments Mayonnaise. Pre-made sauces and marinades. The items listed above may not be a complete list of foods and beverages you should limit. Contact a dietitian for more information. Summary The Mediterranean diet includes both food and lifestyle  choices. Eat a variety of fresh fruits and vegetables, beans, nuts, seeds, and whole grains. Limit the amount of red meat and sweets that you eat. If recommended by your health care provider, drink red wine in moderation. This means 1 glass a day for nonpregnant women and 2 glasses a day for men. A glass of wine equals 5 oz (150 mL). This information is not intended to replace advice given to you by your health care provider. Make sure you discuss any questions you have with your health care provider. Document Revised: 10/23/2019 Document Reviewed: 08/20/2019 Elsevier Patient Education  [place]

## 2022-05-30 ENCOUNTER — Ambulatory Visit (HOSPITAL_COMMUNITY): Payer: Medicare Other | Attending: Cardiology

## 2022-05-30 ENCOUNTER — Ambulatory Visit: Payer: Medicare Other

## 2022-05-30 DIAGNOSIS — R0789 Other chest pain: Secondary | ICD-10-CM | POA: Insufficient documentation

## 2022-05-30 DIAGNOSIS — Z79899 Other long term (current) drug therapy: Secondary | ICD-10-CM | POA: Diagnosis not present

## 2022-05-30 DIAGNOSIS — I25118 Atherosclerotic heart disease of native coronary artery with other forms of angina pectoris: Secondary | ICD-10-CM | POA: Diagnosis not present

## 2022-05-30 LAB — MYOCARDIAL PERFUSION IMAGING
Base ST Depression (mm): 0 mm
LV dias vol: 119 mL (ref 62–150)
LV sys vol: 46 mL
Nuc Stress EF: 61 %
Peak HR: 79 {beats}/min
Rest HR: 54 {beats}/min
Rest Nuclear Isotope Dose: 10.7 mCi
SDS: 0
SRS: 0
SSS: 0
ST Depression (mm): 0 mm
Stress Nuclear Isotope Dose: 31.2 mCi
TID: 0.95

## 2022-05-30 LAB — BASIC METABOLIC PANEL
BUN/Creatinine Ratio: 14 (ref 10–24)
BUN: 14 mg/dL (ref 8–27)
CO2: 24 mmol/L (ref 20–29)
Calcium: 9.4 mg/dL (ref 8.6–10.2)
Chloride: 104 mmol/L (ref 96–106)
Creatinine, Ser: 0.97 mg/dL (ref 0.76–1.27)
Glucose: 90 mg/dL (ref 70–99)
Potassium: 4.7 mmol/L (ref 3.5–5.2)
Sodium: 141 mmol/L (ref 134–144)
eGFR: 85 mL/min/{1.73_m2} (ref >59)

## 2022-05-30 MED ORDER — TECHNETIUM TC 99M TETROFOSMIN IV KIT
10.7000 | PACK | Freq: Once | INTRAVENOUS | Status: AC | PRN
  Administered 2022-05-30: 10.7 via INTRAVENOUS

## 2022-05-30 MED ORDER — REGADENOSON 0.4 MG/5ML IV SOLN
0.4000 mg | Freq: Once | INTRAVENOUS | Status: AC
  Administered 2022-05-30: 0.4 mg via INTRAVENOUS

## 2022-05-30 MED ORDER — TECHNETIUM TC 99M TETROFOSMIN IV KIT
31.2000 | PACK | Freq: Once | INTRAVENOUS | Status: AC | PRN
  Administered 2022-05-30: 31.2 via INTRAVENOUS

## 2022-06-06 ENCOUNTER — Other Ambulatory Visit (HOSPITAL_COMMUNITY)
Admission: RE | Admit: 2022-06-06 | Discharge: 2022-06-06 | Disposition: A | Discharge status: Home / Self Care | Payer: Medicare Other | Source: Ambulatory Visit | Attending: Nurse Practitioner | Admitting: Nurse Practitioner

## 2022-06-06 DIAGNOSIS — R0789 Other chest pain: Secondary | ICD-10-CM | POA: Insufficient documentation

## 2022-06-06 DIAGNOSIS — I2510 Atherosclerotic heart disease of native coronary artery without angina pectoris: Secondary | ICD-10-CM | POA: Diagnosis not present

## 2022-06-06 LAB — BASIC METABOLIC PANEL
Anion gap: 7 (ref 5–15)
BUN: 12 mg/dL (ref 8–23)
CO2: 24 mmol/L (ref 22–32)
Calcium: 8.5 mg/dL — ABNORMAL LOW (ref 8.9–10.3)
Chloride: 108 mmol/L (ref 98–111)
Creatinine, Ser: 0.94 mg/dL (ref 0.61–1.24)
GFR, Estimated: >60 mL/min (ref >60)
Glucose, Bld: 141 mg/dL — ABNORMAL HIGH (ref 70–99)
Potassium: 4 mmol/L (ref 3.5–5.1)
Sodium: 139 mmol/L (ref 135–145)

## 2022-06-20 ENCOUNTER — Telehealth: Payer: Self-pay | Admitting: Internal Medicine

## 2022-06-20 NOTE — Telephone Encounter (Signed)
BP overall looks good. Leg swelling may be secondary to amlodipine. Please reduce amlodipine to 2.5 mg daily and increase valsartan to 80 mg daily. Get a BMET on 9/28 or 9/29 and continue to monitor BP 2-3 times weekly. Report to [place] if he has consistently elevated BP (> 140 or > 89) or if he experiences any concerning symptoms with the change in medication.

## 2022-06-20 NOTE — Telephone Encounter (Signed)
Patient called to report two week results of blood pressure readings on new medication  9/6, 5:30 pm  134/60  HR 67 9/7, 8 am  118/60  HR 56 9/7, 6 pm  139/68  HR 68 9/8, 8 am  126/64  HR 57 9/8, 2 pm  109/56  HR 75 9/9, 12 pm  140/67  HR 60 9/9. 6 pm  114/62  HR 83 9/10, 11 am  135/65  HR 76 9/10, 6 pm  146/68  HR 68 9/11, 10 am  126/65  HR 77 9/11, 6:30 pm  125/65  HR 72 9/12, 8:30 am  126/63  HR 70 9/12, 5:30 pm  136/64  HR 69 9/13, 8 am  126/64  HR 71 9/13, 5:30 pm 115/65  HR 69 9/14, 8:30 am  120/65  HR 67 9/14, 6:30 pm  128/64  HR 56 9/15, 8 am  134/63  HR 72 9/15, 7:30 pm  100/54  HR 60 9/16, 9:30 am  145/60  HR 65 9/16, 7 pm  134/62  HR 60 9/17, 9 am  130/71  HR 62 9/17, 6:30 pm  147/68  HR 56 9/18, 9:30 am  140/68  HR 77 9/18, 5 pm  141/64  HR 68 9/19, 8 am  133/73  HR 60 9/19, 6:30 pm  148/68  HR 68 9/20, 9:30 am  133/74  HR 57  Pt c/o swelling: STAT is pt has developed SOB within 24 hours  If swelling, where is the swelling located? Legs and ankles  How much weight have you gained and in what time span? 5 lbs  Have you gained 3 pounds in a day or 5 pounds in a week?  5 lbs in 3 days but fluctuates  Do you have a log of your daily weights (if so, list)? No  Are you currently taking a fluid pill? No  Are you currently SOB? No  Have you traveled recently? No   Patient stated he is still having fluid in his legs and ankles.

## 2022-06-21 ENCOUNTER — Other Ambulatory Visit: Payer: Self-pay | Admitting: *Deleted

## 2022-06-21 DIAGNOSIS — I2510 Atherosclerotic heart disease of native coronary artery without angina pectoris: Secondary | ICD-10-CM

## 2022-06-21 DIAGNOSIS — Z79899 Other long term (current) drug therapy: Secondary | ICD-10-CM

## 2022-06-21 MED ORDER — VALSARTAN 40 MG PO TABS: 80.0000 mg | ORAL_TABLET | Freq: Every day | ORAL | 11 refills | Status: DC

## 2022-06-21 MED ORDER — AMLODIPINE BESYLATE 5 MG PO TABS: 2.5000 mg | ORAL_TABLET | Freq: Every day | ORAL | 3 refills | Status: DC

## 2022-06-21 NOTE — Telephone Encounter (Signed)
S/w pt is aware of recommendations. Pt will decrease amlodipine one half tablet by mouth ( 2.5 mg ) daily. Pt will increase valsartan two (2) tablets by mouth (80 mg ) daily.Medication list updated.  Pt will continue to monitor BP and will call if consistently X 3 above 140/90.  Pt will come in on 9/28 to repeat BMET. Appt made, orders in and linked.

## 2022-06-26 DIAGNOSIS — G894 Chronic pain syndrome: Secondary | ICD-10-CM | POA: Diagnosis not present

## 2022-06-26 DIAGNOSIS — M545 Low back pain, unspecified: Secondary | ICD-10-CM | POA: Diagnosis not present

## 2022-06-27 ENCOUNTER — Ambulatory Visit: Payer: Medicare Other | Attending: Nurse Practitioner

## 2022-06-27 DIAGNOSIS — I2510 Atherosclerotic heart disease of native coronary artery without angina pectoris: Secondary | ICD-10-CM | POA: Diagnosis not present

## 2022-06-27 DIAGNOSIS — Z79899 Other long term (current) drug therapy: Secondary | ICD-10-CM | POA: Diagnosis not present

## 2022-06-27 DIAGNOSIS — H35033 Hypertensive retinopathy, bilateral: Secondary | ICD-10-CM | POA: Diagnosis not present

## 2022-06-27 DIAGNOSIS — H35371 Puckering of macula, right eye: Secondary | ICD-10-CM | POA: Diagnosis not present

## 2022-06-27 DIAGNOSIS — H35351 Cystoid macular degeneration, right eye: Secondary | ICD-10-CM | POA: Diagnosis not present

## 2022-06-28 ENCOUNTER — Other Ambulatory Visit: Payer: Medicare Other

## 2022-06-28 LAB — BASIC METABOLIC PANEL
BUN/Creatinine Ratio: 13 (ref 10–24)
BUN: 14 mg/dL (ref 8–27)
CO2: 24 mmol/L (ref 20–29)
Calcium: 9.2 mg/dL (ref 8.6–10.2)
Chloride: 105 mmol/L (ref 96–106)
Creatinine, Ser: 1.06 mg/dL (ref 0.76–1.27)
Glucose: 90 mg/dL (ref 70–99)
Potassium: 5.1 mmol/L (ref 3.5–5.2)
Sodium: 141 mmol/L (ref 134–144)
eGFR: 76 mL/min/{1.73_m2} (ref >59)

## 2022-07-20 ENCOUNTER — Other Ambulatory Visit: Payer: Self-pay

## 2022-07-20 ENCOUNTER — Telehealth: Payer: Self-pay | Admitting: Internal Medicine

## 2022-07-20 MED ORDER — AMLODIPINE BESYLATE 2.5 MG PO TABS: 2.5000 mg | ORAL_TABLET | Freq: Every day | ORAL | 3 refills | Status: DC

## 2022-07-20 MED ORDER — VALSARTAN 80 MG PO TABS: 80.0000 mg | ORAL_TABLET | Freq: Every day | ORAL | 3 refills | Status: DC

## 2022-07-20 MED ORDER — AMLODIPINE BESYLATE 5 MG PO TABS: 2.5000 mg | ORAL_TABLET | Freq: Every day | ORAL | 3 refills | Status: DC

## 2022-07-20 NOTE — Telephone Encounter (Signed)
RX for the pts meds sent in to his Pharmacy... [facility] called and advised to disregard the RX for Amlodipine 5 mg and to only fill the RX for the 2.5 mg daily.   Valsartan 80 mg also sent in for the pt.

## 2022-07-20 NOTE — Telephone Encounter (Signed)
Pt c/o medication issue:  1. Name of Medication:  amLODipine (NORVASC) 5 MG tablet valsartan (DIOVAN) 40 MG tablet  2. How are you currently taking this medication (dosage and times per day)? Amlodipine half a tablet daily, valsartan 2 tablets daily  3. Are you having a reaction (difficulty breathing--STAT)? no  4. What is your medication issue? Patient states he was told to contact the office if the medications did well for him and 90 day supplies would be sent to his pharmacy. He says he would like the valsartan sent in as 80 mg so he can just take 1 tablet of it.

## 2022-07-24 DIAGNOSIS — M545 Low back pain, unspecified: Secondary | ICD-10-CM | POA: Diagnosis not present

## 2022-07-24 DIAGNOSIS — G894 Chronic pain syndrome: Secondary | ICD-10-CM | POA: Diagnosis not present

## 2022-08-16 ENCOUNTER — Other Ambulatory Visit: Payer: Self-pay | Admitting: Internal Medicine

## 2022-08-21 DIAGNOSIS — M5106 Intervertebral disc disorders with myelopathy, lumbar region: Secondary | ICD-10-CM | POA: Diagnosis not present

## 2022-08-21 DIAGNOSIS — G894 Chronic pain syndrome: Secondary | ICD-10-CM | POA: Diagnosis not present

## 2022-08-21 DIAGNOSIS — M542 Cervicalgia: Secondary | ICD-10-CM | POA: Diagnosis not present

## 2022-09-15 NOTE — Progress Notes (Unsigned)
Cardiology Office Note   Date:  09/18/2022   ID:  Jacob [name], Jacob [name] 08-Jun-1951, MRN [id number]  PCP:  Jacob Low, MD  Cardiologist:   Dorris Carnes, MD   Pt presents for f/u of CAD   History of Present Illness: Jacob [name] is a 71 y.o. male with a history of HL, atypical CP and dyspnea on exertion   Myoview in 2019 was normal    I saw him in Sept 2022   Went on to have a CT coronary angio that showed Ca score of approx 1800   Mild to mod CAD on CT  Myoview in Aug 2023 showed no ischemia or infarct     Patinet notes occasional CP   Episodes occur with and without activity   Occasional  Pleuritic    Sharp      L side    Overall he says his breathing is good  Pt notes that his weight went up since started taking meds     Was 189   Now at home weighs 195  BP at home    120s to 143/   Patient urinating less   Note started last summer    Current Meds  Medication Sig   albuterol (PROVENTIL HFA;VENTOLIN HFA) 108 (90 Base) MCG/ACT inhaler Inhale 1-2 puffs into the lungs every 6 (six) hours as needed for wheezing or shortness of breath.   amLODipine (NORVASC) 2.5 MG tablet Take 1 tablet (2.5 mg total) by mouth daily.   aspirin EC 81 MG tablet Take 1 tablet (81 mg total) by mouth daily. Swallow whole.   atorvastatin (LIPITOR) 40 MG tablet Take 40 mg by mouth daily.   docusate sodium (COLACE) 100 MG capsule Take 100 mg by mouth 2 (two) times daily as needed for mild constipation.   esomeprazole (NEXIUM) 40 MG capsule Take 1 capsule by mouth as needed (gerd).    ezetimibe (ZETIA) 10 MG tablet TAKE 1 TABLET(10 MG) BY MOUTH DAILY   fluticasone (FLONASE) 50 MCG/ACT nasal spray SHAKE LQ AND U 2 SPRAYS IEN QD   HYDROcodone-acetaminophen (NORCO) 10-325 MG tablet Take 1 tablet by mouth every 4 (four) hours as needed. pain   isosorbide mononitrate (IMDUR) 30 MG 24 hr tablet TAKE 1 TABLET(30 MG) BY MOUTH DAILY   meloxicam (MOBIC) 15 MG tablet Take 15 mg by mouth as needed.    oxyCODONE  (OXYCONTIN) 20 MG 12 hr tablet Take 20 mg by mouth every 12 (twelve) hours.   promethazine (PHENERGAN) 25 MG tablet Take 25-50 mg by mouth every 8 (eight) hours as needed. nausea   valsartan (DIOVAN) 80 MG tablet Take 1 tablet (80 mg total) by mouth daily.     Allergies:   Patient has no known allergies.   Past Medical History:  Diagnosis Date   Chronic headaches    GERD (gastroesophageal reflux disease)    History of kidney stones    x1 episode   Hypercholesteremia    Hypercholesterolemia    Lumbar radiculopathy    lower back pain- both legs has numbness varies   Otitis media    Skin cancer of nose     Past Surgical History:  Procedure Laterality Date   BACK SURGERY     laminectomy-lumbar x3   COLONOSCOPY WITH PROPOFOL N/A 11/28/2015   Procedure: COLONOSCOPY WITH PROPOFOL;  Surgeon: Garlan Fair, MD;  Location: [facility];  Service: Endoscopy;  Laterality: N/A;   INGUINAL HERNIA REPAIR Right 05/18/2013   Procedure: RIGHT  INGUINAL HERNIA REPAIR ADULT;  Surgeon: Scherry Ran, MD;  Location: [facility];  Service: General;  Laterality: Right;  (no mesh)   SKIN CANCER EXCISION     nose   VASECTOMY       Social History:  The patient  reports that he quit smoking about 39 years ago. His smoking use included cigarettes. He has a 5.00 pack-year smoking history. He has never used smokeless tobacco. He reports that he does not drink alcohol and does not use drugs.   Family History:  The patient's family history includes CAD in his mother.    ROS:  Please see the history of present illness. All other systems are reviewed and  Negative to the above problem except as noted.    PHYSICAL EXAM: VS:  BP 130/60 (BP Location: Left Arm, Patient Position: Sitting, Cuff Size: Normal)   Pulse 72   Ht '5\' 10"'$  (1.778 m)   Wt 206 lb (93.4 kg)   SpO2 96%   BMI 29.56 kg/m   GEN: Well nourished, well developed, in no acute distress  HEENT: normal  Neck: no JVD, No bruits  Cardiac: RRR;  no murmurs,  No LE edema  Respiratory:  clear to auscultation  GI: soft, nontender, nondistended, + BS  No hepatomegaly  MS: no deformity Moving all extremities   Skin: warm and dry, no rash Neuro:  Strength and sensation are intact Psych: euthymic mood, full affect   EKG:  EKG is not ordered today.   Myoview AUg   CT coronary angiogram    Sept 2022  IMPRESSION: 1. Coronary calcium score of 1807. This was 95th percentile for age and sex matched control.   2. Normal coronary origin with right dominance.   3. Nonobstructive CAD   4. Calcified plaque in the left main causes minimal (0-24%) stenosis   5. Mixed plaque in the proximal LAD causes mild (25-49%) stenosis, with high risk plaque features including positive remodeling, [name] attenuation plaque and napkin ring sign. There is calcified plaque in the mid LAD causing minimal (0-24%) stenosis. There is calcified plaque in ostial D1 causing mild (25-49%) stenosis.   6. Calcified plaque caused mild (25-49%) stenosis in the proximal and mid RCA, and causes minimal (0-24%) stenosis in the distal RCA.   7. Calcified plaque in the proximal LCX causes minimal (0-24%) stenosis   8. Dilated ascending aorta measuring 75m   CAD-RADS 2. Mild non-obstructive CAD (25-49%). Consider non-atherosclerotic causes of chest pain. Consider preventive therapy and risk factor modification.   Echo October 2022  Left ventricular ejection fraction, by estimation, is 60 to 65%. The left ventricle has normal function. The left ventricle has no regional wall motion abnormalities. There is mild left ventricular hypertrophy. Left ventricular diastolic parameters were normal. 1. Right ventricular systolic function is normal. The right ventricular size is normal. Tricuspid regurgitation signal is inadequate for assessing PA pressure. 2. 3. Left atrial size was mildly dilated. 4. Right atrial size was mildly dilated. The mitral valve is normal in  structure. No evidence of mitral valve regurgitation. No evidence of mitral stenosis. 5. The aortic valve is tricuspid. Aortic valve regurgitation is mild to moderate. No aortic stenosis is present. 6. 7. Aortic dilatation noted. There is dilatation of the aortic root, measuring 41 mm. The inferior vena cava is normal in size with greater than 50% respiratory variability, suggesting right atrial pressure of 3 mmHg.   Lipid Panel No results found for: "CHOL", "TRIG", "HDL", "CHOLHDL", "VLDL", "LDLCALC", "  LDLDIRECT"    Wt Readings from Last 3 Encounters:  09/18/22 206 lb (93.4 kg)  05/30/22 199 lb (90.3 kg)  05/29/22 199 lb 3.2 oz (90.4 kg)      ASSESSMENT AND PLAN 1  CAD   Pt with severely elevated Ca score   CT coronary angiogram in 2022 without obstructive CAD   Myoview in Aug 2023 showed normal perfusion   The chest pains he is having are atypical   Follow     2  HL   Lipids are controlled  LDL 60  HDL 56  4  HTN  PT's BP at home 120s to 140s      He did nit tolerate amlodipine 5 due to swelling     Work with weight   Hydrate  Limit sodium  5  A1C   Will repeat    Limit carbs       F/U in [place]       Current medicines are reviewed at length with the patient today.  The patient does not have concerns regarding medicines.  Signed, Dorris Carnes, MD  09/18/2022 8:28 AM    [address], [place], [place]  [address] Phone: [phone]; Fax: [phone]

## 2022-09-18 ENCOUNTER — Encounter: Payer: Self-pay | Admitting: Internal Medicine

## 2022-09-18 ENCOUNTER — Ambulatory Visit: Payer: Medicare Other | Attending: Internal Medicine | Admitting: Internal Medicine

## 2022-09-18 VITALS — BP 130/60 | HR 72 | Ht 70.0 in | Wt 206.0 lb

## 2022-09-18 DIAGNOSIS — I25118 Atherosclerotic heart disease of native coronary artery with other forms of angina pectoris: Secondary | ICD-10-CM | POA: Diagnosis not present

## 2022-09-18 DIAGNOSIS — E785 Hyperlipidemia, unspecified: Secondary | ICD-10-CM | POA: Diagnosis not present

## 2022-09-18 DIAGNOSIS — R0789 Other chest pain: Secondary | ICD-10-CM | POA: Diagnosis not present

## 2022-09-18 DIAGNOSIS — Z79899 Other long term (current) drug therapy: Secondary | ICD-10-CM

## 2022-09-18 DIAGNOSIS — R0609 Other forms of dyspnea: Secondary | ICD-10-CM | POA: Diagnosis not present

## 2022-09-18 DIAGNOSIS — I2510 Atherosclerotic heart disease of native coronary artery without angina pectoris: Secondary | ICD-10-CM

## 2022-09-18 LAB — MICROSCOPIC EXAMINATION: WBC, UA: NONE SEEN /hpf (ref 0–5)

## 2022-09-18 LAB — URINALYSIS, ROUTINE W REFLEX MICROSCOPIC
Bilirubin, UA: NEGATIVE
Glucose, UA: NEGATIVE
Ketones, UA: NEGATIVE
Leukocytes,UA: NEGATIVE
Nitrite, UA: NEGATIVE
Protein,UA: NEGATIVE
Specific Gravity, UA: 1.01 (ref 1.005–1.030)
Urobilinogen, Ur: 0.2 mg/dL (ref 0.2–1.0)
pH, UA: 7 (ref 5.0–7.5)

## 2022-09-18 NOTE — Patient Instructions (Signed)
Medication Instructions:   *If you need a refill on your cardiac medications before your next appointment, please call your pharmacy*   Lab Work: BMET, PRO BNP, HGBA1C, UA TODAY  If you have labs (blood work) drawn today and your tests are completely normal, you will receive your results only by: [place] (if you have MyChart) A paper copy in the mail If you have any lab test that is abnormal or we need to change your treatment, we will call you to review the results.   Testing/Procedures:    Follow-Up: At [facility], you and your health needs are our priority.  As part of our continuing mission to provide you with exceptional heart care, we have created designated Provider Care Teams.  These Care Teams include your primary Cardiologist (physician) and Advanced Practice Providers (APPs -  Physician Assistants and Nurse Practitioners) who all work together to provide you with the care you need, when you need it.  We recommend signing up for the patient portal called "MyChart".  Sign up information is provided on this After Visit Summary.  MyChart is used to connect with patients for Virtual Visits (Telemedicine).  Patients are able to view lab/test results, encounter notes, upcoming appointments, etc.  Non-urgent messages can be sent to your provider as well.   To learn more about what you can do with MyChart, go to [email or web address].    Your next appointment:   7  month(s)  The format for your next appointment:   In Person  Provider:   Dorris Carnes, MD     Other Instructions   Important Information About Sugar

## 2022-09-19 DIAGNOSIS — M5106 Intervertebral disc disorders with myelopathy, lumbar region: Secondary | ICD-10-CM | POA: Diagnosis not present

## 2022-09-19 DIAGNOSIS — M542 Cervicalgia: Secondary | ICD-10-CM | POA: Diagnosis not present

## 2022-09-19 DIAGNOSIS — G894 Chronic pain syndrome: Secondary | ICD-10-CM | POA: Diagnosis not present

## 2022-09-19 LAB — PRO B NATRIURETIC PEPTIDE: NT-Pro BNP: 98 pg/mL (ref 0–376)

## 2022-09-19 LAB — BASIC METABOLIC PANEL
BUN/Creatinine Ratio: 14 (ref 10–24)
BUN: 14 mg/dL (ref 8–27)
CO2: 25 mmol/L (ref 20–29)
Calcium: 9.7 mg/dL (ref 8.6–10.2)
Chloride: 104 mmol/L (ref 96–106)
Creatinine, Ser: 1.02 mg/dL (ref 0.76–1.27)
Glucose: 75 mg/dL (ref 70–99)
Potassium: 4.8 mmol/L (ref 3.5–5.2)
Sodium: 141 mmol/L (ref 134–144)
eGFR: 80 mL/min/{1.73_m2} (ref >59)

## 2022-09-19 LAB — HEMOGLOBIN A1C
Est. average glucose Bld gHb Est-mCnc: 126 mg/dL
Hgb A1c MFr Bld: 6 % — ABNORMAL HIGH (ref 4.8–5.6)

## 2022-10-09 ENCOUNTER — Ambulatory Visit: Payer: Medicare Other | Admitting: Internal Medicine

## 2022-10-16 DIAGNOSIS — M5106 Intervertebral disc disorders with myelopathy, lumbar region: Secondary | ICD-10-CM | POA: Diagnosis not present

## 2022-10-16 DIAGNOSIS — M542 Cervicalgia: Secondary | ICD-10-CM | POA: Diagnosis not present

## 2022-10-16 DIAGNOSIS — G894 Chronic pain syndrome: Secondary | ICD-10-CM | POA: Diagnosis not present

## 2022-11-27 DIAGNOSIS — D225 Melanocytic nevi of trunk: Secondary | ICD-10-CM | POA: Diagnosis not present

## 2022-11-27 DIAGNOSIS — G894 Chronic pain syndrome: Secondary | ICD-10-CM | POA: Diagnosis not present

## 2022-11-27 DIAGNOSIS — Z79891 Long term (current) use of opiate analgesic: Secondary | ICD-10-CM | POA: Diagnosis not present

## 2022-11-27 DIAGNOSIS — L821 Other seborrheic keratosis: Secondary | ICD-10-CM | POA: Diagnosis not present

## 2022-11-27 DIAGNOSIS — Z79899 Other long term (current) drug therapy: Secondary | ICD-10-CM | POA: Diagnosis not present

## 2022-11-27 DIAGNOSIS — M5106 Intervertebral disc disorders with myelopathy, lumbar region: Secondary | ICD-10-CM | POA: Diagnosis not present

## 2022-11-27 DIAGNOSIS — L570 Actinic keratosis: Secondary | ICD-10-CM | POA: Diagnosis not present

## 2022-11-27 DIAGNOSIS — M542 Cervicalgia: Secondary | ICD-10-CM | POA: Diagnosis not present

## 2022-11-27 DIAGNOSIS — L814 Other melanin hyperpigmentation: Secondary | ICD-10-CM | POA: Diagnosis not present

## 2022-12-10 DIAGNOSIS — J45909 Unspecified asthma, uncomplicated: Secondary | ICD-10-CM | POA: Diagnosis not present

## 2022-12-10 DIAGNOSIS — K219 Gastro-esophageal reflux disease without esophagitis: Secondary | ICD-10-CM | POA: Diagnosis not present

## 2022-12-10 DIAGNOSIS — I25119 Atherosclerotic heart disease of native coronary artery with unspecified angina pectoris: Secondary | ICD-10-CM | POA: Diagnosis not present

## 2022-12-10 DIAGNOSIS — G43009 Migraine without aura, not intractable, without status migrainosus: Secondary | ICD-10-CM | POA: Diagnosis not present

## 2022-12-10 DIAGNOSIS — E7800 Pure hypercholesterolemia, unspecified: Secondary | ICD-10-CM | POA: Diagnosis not present

## 2022-12-10 DIAGNOSIS — I10 Essential (primary) hypertension: Secondary | ICD-10-CM | POA: Diagnosis not present

## 2022-12-10 DIAGNOSIS — G8929 Other chronic pain: Secondary | ICD-10-CM | POA: Diagnosis not present

## 2022-12-10 DIAGNOSIS — I77810 Thoracic aortic ectasia: Secondary | ICD-10-CM | POA: Diagnosis not present

## 2022-12-14 ENCOUNTER — Telehealth: Payer: Self-pay | Admitting: Internal Medicine

## 2022-12-14 NOTE — Telephone Encounter (Signed)
Spoke with pt calling in today because he has been feeling increasingly short of breath.  Seems to be increased over the last 3 weeks.  He reports SOB most all times but occasionally become worse if walking up an incline.  Breathing returns to normal with rest.  He denies any edema at feet/ankles or in abdomen.  No noted increase in weight.  He reports "chest pain" that he has had for along time.  He now reports a pain in his back, behind his left shoulder blade.  Sharp. Discomfort last 30-40 seconds.  Has been occurring for 1 to 2 months.  Comes and goes on its own.  He denies any cough or cold symptoms.  No known recent falls or injuries.  He is also reporting he has noticed increase in heart rate.  He reports it "gets real fast and hard" for about 30 mins during activity but resolves with rest.  Pt recently saw his PCP but all they talked about was his blood sugar.  Reviewed ED precautions.  Pt states understanding and was appreciative of the call back and information.  Schedule for in office evaluation Tuesday 3/19 with Dr Johney Frame (DOD).

## 2022-12-14 NOTE — Telephone Encounter (Signed)
Pt c/o Shortness Of Breath: STAT if SOB developed within the last 24 hours or pt is noticeably SOB on the phone  1. Are you currently SOB (can you hear that pt is SOB on the phone)? Yes   2. How long have you been experiencing SOB? 2 or 3 weeks   3. Are you SOB when sitting or when up moving around? When moving around mostly   4. Are you currently experiencing any other symptoms? Tightness in chest w/ strong hard beats, also has pain in left shoulder blade.

## 2022-12-15 NOTE — Progress Notes (Unsigned)
Cardiology Office Note:   Date:  12/15/2022  ID:  Jacob [name], DOB Feb 17, 1951, MRN [id number]  History of Present Illness:   Jacob [name] is a 72 y.o. male HLD, nonobstructive CAD on coronary CTA in 06/2021 with normal myoview on 05/2022, and HTN who presents to clinic for further evaluation of DOE.  Today, ***  ROS: ***  Studies Reviewed:    EKG:  ***  Cardiac Studies & Procedures     STRESS TESTS  MYOCARDIAL PERFUSION IMAGING 05/30/2022  Narrative   The study is normal. The study is low risk.   No ST deviation was noted.   LV perfusion is normal. There is no evidence of ischemia. There is no evidence of infarction.   Left ventricular function is normal. Nuclear stress EF: 61 %. The left ventricular ejection fraction is normal (55-65%). End diastolic cavity size is normal. End systolic cavity size is normal.   ECHOCARDIOGRAM  ECHOCARDIOGRAM COMPLETE 07/10/2021  Narrative ECHOCARDIOGRAM REPORT    Patient Name:   Jacob [name] Date of Exam: 07/10/2021 Medical Rec #:  [id number]     Height:       70.0 in Accession #:    [id number]    Weight:       196.4 lb Date of Birth:  Jun 07, 1951    BSA:          2.071 m Patient Age:    31 years      BP:           137/62 mmHg Patient Gender: M             HR:           63 bpm. Exam Location:  [facility]  Procedure: 2D Echo, Cardiac Doppler and Color Doppler  Indications:    R06.02 (ICD-10-CM) - SOB (shortness of breath)  History:        Patient has no prior history of Echocardiogram examinations. Risk Factors:Dyslipidemia. GERD, DOE (dyspnea on exertion).  Sonographer:    Alvino Chapel RCS Referring Phys: [id number] PAULA V ROSS  IMPRESSIONS   1. Left ventricular ejection fraction, by estimation, is 60 to 65%. The left ventricle has normal function. The left ventricle has no regional wall motion abnormalities. There is mild left ventricular hypertrophy. Left ventricular diastolic parameters were normal. 2. Right ventricular  systolic function is normal. The right ventricular size is normal. Tricuspid regurgitation signal is inadequate for assessing PA pressure. 3. Left atrial size was mildly dilated. 4. Right atrial size was mildly dilated. 5. The mitral valve is normal in structure. No evidence of mitral valve regurgitation. No evidence of mitral stenosis. 6. The aortic valve is tricuspid. Aortic valve regurgitation is mild to moderate. No aortic stenosis is present. 7. Aortic dilatation noted. There is dilatation of the aortic root, measuring 41 mm. 8. The inferior vena cava is normal in size with greater than 50% respiratory variability, suggesting right atrial pressure of 3 mmHg.  FINDINGS Left Ventricle: Left ventricular ejection fraction, by estimation, is 60 to 65%. The left ventricle has normal function. The left ventricle has no regional wall motion abnormalities. The left ventricular internal cavity size was normal in size. There is mild left ventricular hypertrophy. Left ventricular diastolic parameters were normal.  Right Ventricle: The right ventricular size is normal. No increase in right ventricular wall thickness. Right ventricular systolic function is normal. Tricuspid regurgitation signal is inadequate for assessing PA pressure.  Left Atrium: Left atrial size was mildly dilated.  Right Atrium: Right atrial size was mildly dilated.  Pericardium: There is no evidence of pericardial effusion.  Mitral Valve: The mitral valve is normal in structure. No evidence of mitral valve regurgitation. No evidence of mitral valve stenosis.  Tricuspid Valve: The tricuspid valve is normal in structure. Tricuspid valve regurgitation is trivial.  Aortic Valve: The aortic valve is tricuspid. Aortic valve regurgitation is mild to moderate. Aortic regurgitation PHT measures 458 msec. No aortic stenosis is present.  Pulmonic Valve: The pulmonic valve was not well visualized. Pulmonic valve regurgitation is not  visualized.  Aorta: Aortic dilatation noted. There is dilatation of the aortic root, measuring 41 mm.  Venous: The inferior vena cava is normal in size with greater than 50% respiratory variability, suggesting right atrial pressure of 3 mmHg.  IAS/Shunts: The interatrial septum was not well visualized.   LEFT VENTRICLE PLAX 2D LVIDd:         5.00 cm   Diastology LVIDs:         3.20 cm   LV e' medial:    7.83 cm/s LV PW:         0.90 cm   LV E/e' medial:  11.8 LV IVS:        1.10 cm   LV e' lateral:   13.80 cm/s LVOT diam:     2.20 cm   LV E/e' lateral: 6.7 LV SV:         115 LV SV Index:   55 LVOT Area:     3.80 cm   RIGHT VENTRICLE             IVC RV S prime:     14.90 cm/s  IVC diam: 1.60 cm TAPSE (M-mode): 2.1 cm  LEFT ATRIUM             Index        RIGHT ATRIUM           Index LA diam:        3.40 cm 1.64 cm/m   RA Area:     24.60 cm LA Vol (A2C):   79.9 ml 38.58 ml/m  RA Volume:   75.80 ml  36.60 ml/m LA Vol (A4C):   71.6 ml 34.57 ml/m LA Biplane Vol: 74.8 ml 36.11 ml/m AORTIC VALVE LVOT Vmax:   136.00 cm/s LVOT Vmean:  95.800 cm/s LVOT VTI:    0.302 m AI PHT:      458 msec  AORTA Ao Root diam: 4.10 cm  MITRAL VALVE MV Area (PHT): 3.08 cm    SHUNTS MV Decel Time: 246 msec    Systemic VTI:  0.30 m MV E velocity: 92.10 cm/s  Systemic Diam: 2.20 cm MV A velocity: 69.80 cm/s MV E/A ratio:  1.32  Oswaldo Milian MD Electronically signed by Oswaldo Milian MD Signature Date/Time: 07/10/2021/11:41:00 AM    Final     CT SCANS  CT CORONARY MORPH W/CTA COR W/SCORE 06/27/2021  Addendum 06/27/2021  2:08 PM ADDENDUM REPORT: 06/27/2021 14:06  CLINICAL DATA:  5M with chest pain/DOE  EXAM: Cardiac/Coronary CTA  TECHNIQUE: The patient was scanned on a [employer].  FINDINGS: A 100 kV prospective scan was triggered in the descending thoracic aorta at 111 HU's. Axial non-contrast 3 mm slices were carried out through the heart. The  data set was analyzed on a dedicated work station and scored using the [place]. Gantry rotation speed was 250 msecs and collimation was .6 mm. 0.8 mg of sl NTG was  given. The 3D data set was reconstructed in 5% intervals of the 35-75 % of the R-R cycle. Phases were analyzed on a dedicated work station using MPR, MIP and VRT modes. The patient received 80 cc of contrast.  Coronary Arteries:  Normal coronary origin.  Right dominance.  RCA is a large dominant artery that gives rise to PDA and PLA. There is calcified plaque in the proximal RCA causing 25-49% stenosis. There is calcified plaque in the mid RCA causing 25-49% stenosis. There is calcified plaque in the distal RCA causing 0-24% stenosis  Left main is a large artery that gives rise to LAD and LCX arteries. There is calcified plaque in the left main causing 0-24% stenosis  LAD is a large vessel. There is calcified plaque in the proximal LAD causing 25-49% stenosis, with high risk plaque features including positive remodeling, low attenuation plaque and napkin ring sign. There is calcified plaque in the mid LAD causing 0-24% stenosis. There is calcified plaque in ostial D1 causing 25-49% stenosis.  LCX is a non-dominant artery that gives rise to one large OM1 branch. There is calcified plaque in the proximal LCX causing 0-24% stenosis  Other findings:  Left Ventricle: Mild dilatation  Left Atrium: Mild enlargement  Pulmonary Veins: Normal configuration  Right Ventricle: Moderate dilatation  Right Atrium: Mild enlargement  Cardiac valves: No calcifications  Thoracic aorta: Dilated ascending aorta measuring 24mm  Pulmonary Arteries: Normal size  Systemic Veins: Normal drainage  Pericardium: Normal thickness  IMPRESSION: 1. Coronary calcium score of 1807. This was 95th percentile for age and sex matched control.  2. Normal coronary origin with right dominance.  3. Nonobstructive CAD  4. Calcified  plaque in the left main causes minimal (0-24%) stenosis  5. Mixed plaque in the proximal LAD causes mild (25-49%) stenosis, with high risk plaque features including positive remodeling, low attenuation plaque and napkin ring sign. There is calcified plaque in the mid LAD causing minimal (0-24%) stenosis. There is calcified plaque in ostial D1 causing mild (25-49%) stenosis.  6. Calcified plaque caused mild (25-49%) stenosis in the proximal and mid RCA, and causes minimal (0-24%) stenosis in the distal RCA.  7. Calcified plaque in the proximal LCX causes minimal (0-24%) stenosis  8. Dilated ascending aorta measuring 52mm  CAD-RADS 2. Mild non-obstructive CAD (25-49%). Consider non-atherosclerotic causes of chest pain. Consider preventive therapy and risk factor modification.   Electronically Signed By: Oswaldo Milian M.D. On: 06/27/2021 14:06  Narrative EXAM: OVER-READ INTERPRETATION  CT CHEST  The following report is an over-read performed by radiologist Dr. Rolm Baptise of [facility], [place] on 06/27/2021. This over-read does not include interpretation of cardiac or coronary anatomy or pathology. The coronary CTA interpretation by the cardiologist is attached.  COMPARISON:  None.  FINDINGS: Vascular: Heart is normal size.  Aorta normal caliber.  Mediastinum/Nodes: No adenopathy  Lungs/Pleura: No confluent opacities or effusions.  Upper Abdomen: Imaging into the upper abdomen demonstrates no acute findings. Cysts noted in the visualized upper liver.  Musculoskeletal: Chest wall soft tissues are unremarkable. No acute bony abnormality.  IMPRESSION: No acute extra cardiac abnormality.  Electronically Signed: By: Rolm Baptise M.D. On: 06/27/2021 08:56          Risk Assessment/Calculations:   {Does this patient have ATRIAL FIBRILLATION?:[phone]} No BP recorded.  {Refresh Note OR Click here to enter BP  :1}***        Physical Exam:   VS:   There were no vitals taken for this  visit.   Wt Readings from Last 3 Encounters:  09/18/22 206 lb (93.4 kg)  05/30/22 199 lb (90.3 kg)  05/29/22 199 lb 3.2 oz (90.4 kg)     GEN: Well nourished, well developed in no acute distress NECK: No JVD; No carotid bruits CARDIAC: ***RRR, no murmurs, rubs, gallops RESPIRATORY:  Clear to auscultation without rales, wheezing or rhonchi  ABDOMEN: Soft, non-tender, non-distended EXTREMITIES:  No edema; No deformity   ASSESSMENT AND PLAN:   #DOE: Reassuring cardiac work-up with coronary CTA with mild to moderate nonobstructive CAD with Ca score 1800. Myoview 05/2022 with no ischemia/infarction. TTE 07/2021 with EF 60-65%, normal RV, mild dilation of aorta. Currently,with continued symptoms of dyspnea on exertion. Given reassuring work-up from a CV standpoint, will refer to [facility] for further evaluation. -Refer to [facility] for further evaluation  #Mild to moderate nonobstructive CAD: Noted on CTA in 2022 as detailed above. Myoview without ischemia. -Continue ASA 81mg  daily -Continue lipitor 40mg  daily  #Palpitations: -  #HTN: -Increase valsartan to 160mg  daily -Continue amlodipine 2.5mg  daily -Continue imdur 30mg  daily -Check BMET  #HLD: -Continue lipitor 40mg  daily    {Are you ordering a CV Procedure (e.g. stress test, cath, DCCV, TEE, etc)?   Press F2        :[id number]   Signed, Freada Bergeron, MD

## 2022-12-18 ENCOUNTER — Encounter: Payer: Self-pay | Admitting: Cardiology

## 2022-12-18 ENCOUNTER — Ambulatory Visit: Payer: Medicare Other | Attending: Cardiology | Admitting: Cardiology

## 2022-12-18 ENCOUNTER — Telehealth: Payer: Self-pay | Admitting: *Deleted

## 2022-12-18 ENCOUNTER — Ambulatory Visit (INDEPENDENT_AMBULATORY_CARE_PROVIDER_SITE_OTHER): Payer: Medicare Other

## 2022-12-18 VITALS — BP 140/70 | HR 73 | Ht 70.0 in | Wt 201.6 lb

## 2022-12-18 DIAGNOSIS — R002 Palpitations: Secondary | ICD-10-CM | POA: Diagnosis not present

## 2022-12-18 DIAGNOSIS — Z79899 Other long term (current) drug therapy: Secondary | ICD-10-CM

## 2022-12-18 DIAGNOSIS — R0609 Other forms of dyspnea: Secondary | ICD-10-CM | POA: Diagnosis not present

## 2022-12-18 DIAGNOSIS — E7849 Other hyperlipidemia: Secondary | ICD-10-CM

## 2022-12-18 DIAGNOSIS — I10 Essential (primary) hypertension: Secondary | ICD-10-CM | POA: Diagnosis not present

## 2022-12-18 DIAGNOSIS — I2510 Atherosclerotic heart disease of native coronary artery without angina pectoris: Secondary | ICD-10-CM

## 2022-12-18 MED ORDER — VALSARTAN 160 MG PO TABS: 160.0000 mg | ORAL_TABLET | Freq: Every day | ORAL | 2 refills | Status: DC

## 2022-12-18 NOTE — Progress Notes (Unsigned)
Enrolled for [employer] to mail a ZIO XT long term holter monitor to the patients address on file.  

## 2022-12-18 NOTE — Progress Notes (Signed)
Cardiology Office Note:   Date:  12/18/2022  ID:  JYREE DEE, DOB 1951/08/21, MRN [id number]  History of Present Illness:   Jacob [name] is a 72 y.o. male HLD, nonobstructive CAD on coronary CTA in 06/2021 with normal myoview on 05/2022, and HTN who presents to clinic for further evaluation of DOE.  Today, the patient has several concerns he would like to address. First, he is still experiencing DOE for past 6 months when doing things such as walking up stairs or walking from living room to kitchen. This has been stable and is not progressing. We discussed his reassuring cardiac work-up and discussed that he may be evaluated by Pulm given persistence of symptoms.  Second, he intermittently feels like his heart is beating fast and hard or flutters. This is not exertion related and mainly occurs at night. Symptoms last about 30 seconds before resolving on their own. Has occasional associated lightheadedness.  Third, he continues to have  intermittent left sided achy chest pain which occurs a few days/week lasting less than an hour. This has been ongoing for over a year. These symptoms are not exertional in nature and have been ongoing since his prior visit with Dr. Harrington Challenger. Work-up has been reassuring as detailed above.  Blood pressure has been mainly 120-140s at home with rare episodes of 160s. He is unsure what his blood pressure is running at the time of his symptoms.   Studies Reviewed:    EKG:  Personally reviewed today and shows NSR with HR 73  Cardiac Studies & Procedures     STRESS TESTS  MYOCARDIAL PERFUSION IMAGING 05/30/2022  Narrative   The study is normal. The study is low risk.   No ST deviation was noted.   LV perfusion is normal. There is no evidence of ischemia. There is no evidence of infarction.   Left ventricular function is normal. Nuclear stress EF: 61 %. The left ventricular ejection fraction is normal (55-65%). End diastolic cavity size is normal. End systolic  cavity size is normal.   ECHOCARDIOGRAM  ECHOCARDIOGRAM COMPLETE 07/10/2021  Narrative ECHOCARDIOGRAM REPORT    Patient Name:   KUNAAL MALAVE Date of Exam: 07/10/2021 Medical Rec #:  [id number]     Height:       70.0 in Accession #:    [id number]    Weight:       196.4 lb Date of Birth:  31-May-1951    BSA:          2.071 m Patient Age:    4 years      BP:           137/62 mmHg Patient Gender: M             HR:           63 bpm. Exam Location:  [facility]  Procedure: 2D Echo, Cardiac Doppler and Color Doppler  Indications:    R06.02 (ICD-10-CM) - SOB (shortness of breath)  History:        Patient has no prior history of Echocardiogram examinations. Risk Factors:Dyslipidemia. GERD, DOE (dyspnea on exertion).  Sonographer:    Alvino Chapel RCS Referring Phys: [id number] PAULA V ROSS  IMPRESSIONS   1. Left ventricular ejection fraction, by estimation, is 60 to 65%. The left ventricle has normal function. The left ventricle has no regional wall motion abnormalities. There is mild left ventricular hypertrophy. Left ventricular diastolic parameters were normal. 2. Right ventricular systolic function is normal. The right ventricular  size is normal. Tricuspid regurgitation signal is inadequate for assessing PA pressure. 3. Left atrial size was mildly dilated. 4. Right atrial size was mildly dilated. 5. The mitral valve is normal in structure. No evidence of mitral valve regurgitation. No evidence of mitral stenosis. 6. The aortic valve is tricuspid. Aortic valve regurgitation is mild to moderate. No aortic stenosis is present. 7. Aortic dilatation noted. There is dilatation of the aortic root, measuring 41 mm. 8. The inferior vena cava is normal in size with greater than 50% respiratory variability, suggesting right atrial pressure of 3 mmHg.  FINDINGS Left Ventricle: Left ventricular ejection fraction, by estimation, is 60 to 65%. The left ventricle has normal function. The left  ventricle has no regional wall motion abnormalities. The left ventricular internal cavity size was normal in size. There is mild left ventricular hypertrophy. Left ventricular diastolic parameters were normal.  Right Ventricle: The right ventricular size is normal. No increase in right ventricular wall thickness. Right ventricular systolic function is normal. Tricuspid regurgitation signal is inadequate for assessing PA pressure.  Left Atrium: Left atrial size was mildly dilated.  Right Atrium: Right atrial size was mildly dilated.  Pericardium: There is no evidence of pericardial effusion.  Mitral Valve: The mitral valve is normal in structure. No evidence of mitral valve regurgitation. No evidence of mitral valve stenosis.  Tricuspid Valve: The tricuspid valve is normal in structure. Tricuspid valve regurgitation is trivial.  Aortic Valve: The aortic valve is tricuspid. Aortic valve regurgitation is mild to moderate. Aortic regurgitation PHT measures 458 msec. No aortic stenosis is present.  Pulmonic Valve: The pulmonic valve was not well visualized. Pulmonic valve regurgitation is not visualized.  Aorta: Aortic dilatation noted. There is dilatation of the aortic root, measuring 41 mm.  Venous: The inferior vena cava is normal in size with greater than 50% respiratory variability, suggesting right atrial pressure of 3 mmHg.  IAS/Shunts: The interatrial septum was not well visualized.   LEFT VENTRICLE PLAX 2D LVIDd:         5.00 cm   Diastology LVIDs:         3.20 cm   LV e' medial:    7.83 cm/s LV PW:         0.90 cm   LV E/e' medial:  11.8 LV IVS:        1.10 cm   LV e' lateral:   13.80 cm/s LVOT diam:     2.20 cm   LV E/e' lateral: 6.7 LV SV:         115 LV SV Index:   55 LVOT Area:     3.80 cm   RIGHT VENTRICLE             IVC RV S prime:     14.90 cm/s  IVC diam: 1.60 cm TAPSE (M-mode): 2.1 cm  LEFT ATRIUM             Index        RIGHT ATRIUM           Index LA  diam:        3.40 cm 1.64 cm/m   RA Area:     24.60 cm LA Vol (A2C):   79.9 ml 38.58 ml/m  RA Volume:   75.80 ml  36.60 ml/m LA Vol (A4C):   71.6 ml 34.57 ml/m LA Biplane Vol: 74.8 ml 36.11 ml/m AORTIC VALVE LVOT Vmax:   136.00 cm/s LVOT Vmean:  95.800 cm/s LVOT VTI:  0.302 m AI PHT:      458 msec  AORTA Ao Root diam: 4.10 cm  MITRAL VALVE MV Area (PHT): 3.08 cm    SHUNTS MV Decel Time: 246 msec    Systemic VTI:  0.30 m MV E velocity: 92.10 cm/s  Systemic Diam: 2.20 cm MV A velocity: 69.80 cm/s MV E/A ratio:  1.32  Oswaldo Milian MD Electronically signed by Oswaldo Milian MD Signature Date/Time: 07/10/2021/11:41:00 AM    Final     CT SCANS  CT CORONARY MORPH W/CTA COR W/SCORE 06/27/2021  Addendum 06/27/2021  2:08 PM ADDENDUM REPORT: 06/27/2021 14:06  CLINICAL DATA:  27M with chest pain/DOE  EXAM: Cardiac/Coronary CTA  TECHNIQUE: The patient was scanned on a [employer].  FINDINGS: A 100 kV prospective scan was triggered in the descending thoracic aorta at 111 HU's. Axial non-contrast 3 mm slices were carried out through the heart. The data set was analyzed on a dedicated work station and scored using the [place]. Gantry rotation speed was 250 msecs and collimation was .6 mm. 0.8 mg of sl NTG was given. The 3D data set was reconstructed in 5% intervals of the 35-75 % of the R-R cycle. Phases were analyzed on a dedicated work station using MPR, MIP and VRT modes. The patient received 80 cc of contrast.  Coronary Arteries:  Normal coronary origin.  Right dominance.  RCA is a large dominant artery that gives rise to PDA and PLA. There is calcified plaque in the proximal RCA causing 25-49% stenosis. There is calcified plaque in the mid RCA causing 25-49% stenosis. There is calcified plaque in the distal RCA causing 0-24% stenosis  Left main is a large artery that gives rise to LAD and LCX arteries. There is calcified plaque  in the left main causing 0-24% stenosis  LAD is a large vessel. There is calcified plaque in the proximal LAD causing 25-49% stenosis, with high risk plaque features including positive remodeling, low attenuation plaque and napkin ring sign. There is calcified plaque in the mid LAD causing 0-24% stenosis. There is calcified plaque in ostial D1 causing 25-49% stenosis.  LCX is a non-dominant artery that gives rise to one large OM1 branch. There is calcified plaque in the proximal LCX causing 0-24% stenosis  Other findings:  Left Ventricle: Mild dilatation  Left Atrium: Mild enlargement  Pulmonary Veins: Normal configuration  Right Ventricle: Moderate dilatation  Right Atrium: Mild enlargement  Cardiac valves: No calcifications  Thoracic aorta: Dilated ascending aorta measuring 67mm  Pulmonary Arteries: Normal size  Systemic Veins: Normal drainage  Pericardium: Normal thickness  IMPRESSION: 1. Coronary calcium score of 1807. This was 95th percentile for age and sex matched control.  2. Normal coronary origin with right dominance.  3. Nonobstructive CAD  4. Calcified plaque in the left main causes minimal (0-24%) stenosis  5. Mixed plaque in the proximal LAD causes mild (25-49%) stenosis, with high risk plaque features including positive remodeling, low attenuation plaque and napkin ring sign. There is calcified plaque in the mid LAD causing minimal (0-24%) stenosis. There is calcified plaque in ostial D1 causing mild (25-49%) stenosis.  6. Calcified plaque caused mild (25-49%) stenosis in the proximal and mid RCA, and causes minimal (0-24%) stenosis in the distal RCA.  7. Calcified plaque in the proximal LCX causes minimal (0-24%) stenosis  8. Dilated ascending aorta measuring 78mm  CAD-RADS 2. Mild non-obstructive CAD (25-49%). Consider non-atherosclerotic causes of chest pain. Consider preventive therapy and risk factor modification.  Electronically  Signed By: Oswaldo Milian M.D. On: 06/27/2021 14:06  Narrative EXAM: OVER-READ INTERPRETATION  CT CHEST  The following report is an over-read performed by radiologist Dr. Rolm Baptise of [facility], [place] on 06/27/2021. This over-read does not include interpretation of cardiac or coronary anatomy or pathology. The coronary CTA interpretation by the cardiologist is attached.  COMPARISON:  None.  FINDINGS: Vascular: Heart is normal size.  Aorta normal caliber.  Mediastinum/Nodes: No adenopathy  Lungs/Pleura: No confluent opacities or effusions.  Upper Abdomen: Imaging into the upper abdomen demonstrates no acute findings. Cysts noted in the visualized upper liver.  Musculoskeletal: Chest wall soft tissues are unremarkable. No acute bony abnormality.  IMPRESSION: No acute extra cardiac abnormality.  Electronically Signed: By: Rolm Baptise M.D. On: 06/27/2021 08:56          Risk Assessment/Calculations:          Physical Exam:   VS:  BP (!) 140/70   Pulse 73   Ht 5\' 10"  (1.778 m)   Wt 201 lb 9.6 oz (91.4 kg)   SpO2 98%   BMI 28.93 kg/m    Wt Readings from Last 3 Encounters:  12/18/22 201 lb 9.6 oz (91.4 kg)  09/18/22 206 lb (93.4 kg)  05/30/22 199 lb (90.3 kg)     GEN: Well nourished, well developed in no acute distress NECK: No JVD; No carotid bruits CARDIAC: RRR, no murmurs, rubs, gallops RESPIRATORY:  Clear to auscultation without rales, wheezing or rhonchi  ABDOMEN: Soft, non-tender, non-distended EXTREMITIES:  No edema; No deformity   ASSESSMENT AND PLAN:   #DOE: Reassuring cardiac work-up with coronary CTA with mild to moderate nonobstructive CAD with Ca score 1800. Myoview 05/2022 with no ischemia/infarction. TTE 07/2021 with EF 60-65%, normal RV, mild dilation of aorta. Currently, with continued symptoms of dyspnea on exertion. Given reassuring work-up from a CV standpoint, will refer to [facility] for further evaluation. -Refer to [facility] for  further evaluation  #Mild to moderate nonobstructive CAD: Noted on CTA in 2022 as detailed above. Myoview 05/2022 without ischemia. Left sided chest pain does not sound cardiac in nature and has been ongoing for >1 year and is not exertion related. Will continue with medical management. -Continue ASA 81mg  daily -Continue lipitor 40mg  daily  #Palpitations: Has been having intermittent heart palpitations mainly at night. Symptoms last 30s at time and occur about 2-3x/week. -Check 7 day zio for further monitoring  #HTN: Running 120-140s at home with episodes up to 160. Will increase valsartan to 160mg  daily and monitor response as BP trending more towards 140s. May have to back down as K is on the high end. -Increase valsartan to 160mg  daily -Continue amlodipine 2.5mg  daily -Continue imdur 30mg  daily -Check BMET  #HLD: -Continue lipitor 40mg  daily       Signed, Freada Bergeron, MD

## 2022-12-18 NOTE — Patient Instructions (Signed)
Medication Instructions:   INCREASE YOUR VALSARTAN TO 160 MG BY MOUTH DAILY  *If you need a refill on your cardiac medications before your next appointment, please call your pharmacy*    You have been referred to [facility] LOCATION--THEIR OFFICE WILL CALL YOU TO [place]   Lab Work:  IN ONE WEEK AT [place] [facility]--PLEASE GO TO THE [facility] IN [place] TO HAVE THIS DONE IN ONE WEEK  If you have labs (blood work) drawn today and your tests are completely normal, you will receive your results only by: [place] (if you have MyChart) OR A paper copy in the mail If you have any lab test that is abnormal or we need to change your treatment, we will call you to review the results.   Testing/Procedures:  [facility]- Long Term Monitor Instructions  Your physician has requested you wear a ZIO patch monitor for 7 days.  This is a single patch monitor. [employer] supplies one patch monitor per enrollment. Additional stickers are not available. Please do not apply patch if you will be having a Nuclear Stress Test,  Echocardiogram, Cardiac CT, MRI, or Chest Xray during the period you would be wearing the  monitor. The patch cannot be worn during these tests. You cannot remove and re-apply the  ZIO XT patch monitor.  Your ZIO patch monitor will be mailed 3 day USPS to your address on file. It may take 3-5 days  to receive your monitor after you have been enrolled.  Once you have received your monitor, please review the enclosed instructions. Your monitor  has already been registered assigning a specific monitor serial # to you.  Billing and Patient Assistance Program Information  We have supplied [employer] with any of your insurance information on file for billing purposes. [employer] offers a sliding scale Patient Assistance Program for patients that do not have  insurance, or whose insurance does not completely cover the cost of the ZIO monitor.   You must apply for the Patient Assistance Program to qualify for this discounted rate.  To apply, please call [employer] at [phone], select option 4, select option 2, ask to apply for  Patient Assistance Program. Theodore Demark will ask your household income, and how many people  are in your household. They will quote your out-of-pocket cost based on that information.  [employer] will also be able to set up a 67-month, interest-free payment plan if needed.  Applying the monitor   Shave hair from upper left chest.  Hold abrader disc by orange tab. Rub abrader in 40 strokes over the upper left chest as  indicated in your monitor instructions.  Clean area with 4 enclosed alcohol pads. Let dry.  Apply patch as indicated in monitor instructions. Patch will be placed under collarbone on left  side of chest with arrow pointing upward.  Rub patch adhesive wings for 2 minutes. Remove white label marked "1". Remove the white  label marked "2". Rub patch adhesive wings for 2 additional minutes.  While looking in a mirror, press and release button in center of patch. A small green light will  flash 3-4 times. This will be your only indicator that the monitor has been turned on.  Do not shower for the first 24 hours. You may shower after the first 24 hours.  Press the button if you feel a symptom. You will hear a small click. Record Date, Time and  Symptom in the Patient Logbook.  When you are ready to  remove the patch, follow instructions on the last 2 pages of Patient  Logbook. Stick patch monitor onto the last page of Patient Logbook.  Place Patient Logbook in the blue and white box. Use locking tab on box and tape box closed  securely. The blue and white box has prepaid postage on it. Please place it in the mailbox as  soon as possible. Your physician should have your test results approximately 7 days after the  monitor has been mailed back to [facility].  Call [place] at  [phone] if you have questions regarding  your ZIO XT patch monitor. Call them immediately if you see an orange light blinking on your  monitor.  If your monitor falls off in less than 4 days, contact our Monitor department at [phone].  If your monitor becomes loose or falls off after 4 days call [employer] at [phone] for  suggestions on securing your monitor    Follow-Up:  AS SCHEDULED WITH DR. ROSS ON 01/08/23 AT 8:20 AM

## 2022-12-18 NOTE — Telephone Encounter (Signed)
-----   Message from Jennefer Bravo sent at 12/18/2022 11:20 AM EDT ----- Regarding: RE: 7 DAY ZIO PER DR. Johney Frame Done ----- Message ----- From: Nuala Alpha, LPN Sent: [id number]  10:56 AM EDT To: Jennefer Bravo; Katrina Cloyd Stagers Subject: 7 DAY ZIO PER DR. Johney Frame                    Dr. Johney Frame saw this pt of Dr. Harrington Challenger on DOD today  7 day zio ordered for palpitations  Please enroll and let me know when you do?  Thanks [employer]

## 2022-12-20 DIAGNOSIS — M542 Cervicalgia: Secondary | ICD-10-CM | POA: Diagnosis not present

## 2022-12-20 DIAGNOSIS — G894 Chronic pain syndrome: Secondary | ICD-10-CM | POA: Diagnosis not present

## 2022-12-20 DIAGNOSIS — M5106 Intervertebral disc disorders with myelopathy, lumbar region: Secondary | ICD-10-CM | POA: Diagnosis not present

## 2022-12-24 DIAGNOSIS — R002 Palpitations: Secondary | ICD-10-CM

## 2022-12-27 ENCOUNTER — Other Ambulatory Visit (HOSPITAL_COMMUNITY)
Admission: RE | Admit: 2022-12-27 | Discharge: 2022-12-27 | Disposition: A | Discharge status: Home / Self Care | Payer: Medicare Other | Source: Ambulatory Visit | Attending: Cardiology | Admitting: Cardiology

## 2022-12-27 DIAGNOSIS — R0609 Other forms of dyspnea: Secondary | ICD-10-CM | POA: Insufficient documentation

## 2022-12-27 LAB — BASIC METABOLIC PANEL
Anion gap: 11 (ref 5–15)
BUN: 14 mg/dL (ref 8–23)
CO2: 22 mmol/L (ref 22–32)
Calcium: 9 mg/dL (ref 8.9–10.3)
Chloride: 102 mmol/L (ref 98–111)
Creatinine, Ser: 0.95 mg/dL (ref 0.61–1.24)
GFR, Estimated: >60 mL/min (ref >60)
Glucose, Bld: 157 mg/dL — ABNORMAL HIGH (ref 70–99)
Potassium: 3.8 mmol/L (ref 3.5–5.1)
Sodium: 135 mmol/L (ref 135–145)

## 2023-01-08 ENCOUNTER — Ambulatory Visit: Payer: Medicare Other | Attending: Internal Medicine | Admitting: Internal Medicine

## 2023-01-08 ENCOUNTER — Encounter: Payer: Self-pay | Admitting: Internal Medicine

## 2023-01-08 VITALS — BP 140/66 | HR 70 | Ht 70.0 in | Wt 205.2 lb

## 2023-01-08 DIAGNOSIS — R002 Palpitations: Secondary | ICD-10-CM | POA: Diagnosis not present

## 2023-01-08 DIAGNOSIS — I2510 Atherosclerotic heart disease of native coronary artery without angina pectoris: Secondary | ICD-10-CM

## 2023-01-08 NOTE — Progress Notes (Signed)
Cardiology Office Note   Date:  01/08/2023   ID:  Jacob, [name] 11-25-1950, MRN [id number]  PCP:  Georgann Housekeeper, MD  Cardiologist:   Dietrich Pates, MD   Follow up of CAD and HTN and SOB      History of Present Illness: Jacob [name] is a 72 y.o. male with a history ofhistory of HL, atypical CP and dyspnea on exertion   Myoview in 2019 was normal    I saw him in Sept 2022   Went on to have a CT coronary angio that showed Ca score of approx 1800   Mild to mod CAD on CT  Myoview in Aug 2023 showed no ischemia or infarct     I saw the pt earlier this winter   He was seen by Jon Billings on 12/18/22 while I was out of town.   Cpmplained of SOB x 6 mon  Also noted some fluttering in chest that was not related to exertion   Also intermitt CP on L    Valsartan increased to 160 mg    Patient says after being seen by Jon Billings and increasing meds he has had only one spell of CP   Also says his breathing is improved     Since seen he walks a little    Current Meds  Medication Sig   albuterol (PROVENTIL HFA;VENTOLIN HFA) 108 (90 Base) MCG/ACT inhaler Inhale 1-2 puffs into the lungs every 6 (six) hours as needed for wheezing or shortness of breath.   amLODipine (NORVASC) 2.5 MG tablet Take 1 tablet (2.5 mg total) by mouth daily.   aspirin EC 81 MG tablet Take 1 tablet (81 mg total) by mouth daily. Swallow whole.   atorvastatin (LIPITOR) 40 MG tablet Take 40 mg by mouth daily.   docusate sodium (COLACE) 100 MG capsule Take 100 mg by mouth 2 (two) times daily as needed for mild constipation.   esomeprazole (NEXIUM) 40 MG capsule Take 1 capsule by mouth as needed (gerd).    ezetimibe (ZETIA) 10 MG tablet TAKE 1 TABLET(10 MG) BY MOUTH DAILY   fluticasone (FLONASE) 50 MCG/ACT nasal spray SHAKE LQ AND U 2 SPRAYS IEN QD   HYDROcodone-acetaminophen (NORCO) 10-325 MG tablet Take 1 tablet by mouth every 4 (four) hours as needed. pain   isosorbide mononitrate (IMDUR) 30 MG 24 hr tablet TAKE 1  TABLET(30 MG) BY MOUTH DAILY   meloxicam (MOBIC) 15 MG tablet Take 15 mg by mouth as needed.    oxyCODONE (OXYCONTIN) 20 MG 12 hr tablet Take 20 mg by mouth every 12 (twelve) hours.   promethazine (PHENERGAN) 25 MG tablet Take 25-50 mg by mouth every 8 (eight) hours as needed. nausea   valsartan (DIOVAN) 160 MG tablet Take 1 tablet (160 mg total) by mouth daily.     Allergies:   Patient has no known allergies.   Past Medical History:  Diagnosis Date   Chronic headaches    GERD (gastroesophageal reflux disease)    History of kidney stones    x1 episode   Hypercholesteremia    Hypercholesterolemia    Lumbar radiculopathy    lower back pain- both legs has numbness varies   Otitis media    Skin cancer of nose     Past Surgical History:  Procedure Laterality Date   BACK SURGERY     laminectomy-lumbar x3   COLONOSCOPY WITH PROPOFOL N/A 11/28/2015   Procedure: COLONOSCOPY WITH PROPOFOL;  Surgeon: Jone Baseman  Laural Benes, MD;  Location: [facility];  Service: Endoscopy;  Laterality: N/A;   INGUINAL HERNIA REPAIR Right 05/18/2013   Procedure: RIGHT INGUINAL HERNIA REPAIR ADULT;  Surgeon: Marlane Hatcher, MD;  Location: [facility];  Service: General;  Laterality: Right;  (no mesh)   SKIN CANCER EXCISION     nose   VASECTOMY       Social History:  The patient  reports that he quit smoking about 39 years ago. His smoking use included cigarettes. He has a 5.00 pack-year smoking history. He has never used smokeless tobacco. He reports that he does not drink alcohol and does not use drugs.   Family History:  The patient's family history includes CAD in his mother.    ROS:  Please see the history of present illness. All other systems are reviewed and  Negative to the above problem except as noted.    PHYSICAL EXAM: VS:  BP (!) 140/66   Pulse 70   Ht 5\' 10"  (1.778 m)   Wt 205 lb 3.2 oz (93.1 kg)   SpO2 97%   BMI 29.44 kg/m   GEN: Well nourished, well developed, in no acute distress   HEENT: normal  Neck: no JVD, carotid bruit Cardiac: RRR; no murmur  No LE  edema  Respiratory:  clear to auscultation bilaterally GI: soft, nontender, nondistended  No hepatomegaly    EKG:  EKG is not ordered today.  Myoview AUg    CT coronary angiogram    Sept 2022   IMPRESSION: 1. Coronary calcium score of 1807. This was 95th percentile for age and sex matched control.   2. Normal coronary origin with right dominance.   3. Nonobstructive CAD   4. Calcified plaque in the left main causes minimal (0-24%) stenosis   5. Mixed plaque in the proximal LAD causes mild (25-49%) stenosis, with high risk plaque features including positive remodeling, low attenuation plaque and napkin ring sign. There is calcified plaque in the mid LAD causing minimal (0-24%) stenosis. There is calcified plaque in ostial D1 causing mild (25-49%) stenosis.   6. Calcified plaque caused mild (25-49%) stenosis in the proximal and mid RCA, and causes minimal (0-24%) stenosis in the distal RCA.   7. Calcified plaque in the proximal LCX causes minimal (0-24%) stenosis   8. Dilated ascending aorta measuring 71mm   CAD-RADS 2. Mild non-obstructive CAD (25-49%). Consider non-atherosclerotic causes of chest pain. Consider preventive therapy and risk factor modification.   Echo October 2022   Left ventricular ejection fraction, by estimation, is 60 to 65%. The left ventricle has normal function. The left ventricle has no regional wall motion abnormalities. There is mild left ventricular hypertrophy. Left ventricular diastolic parameters were normal. 1. Right ventricular systolic function is normal. The right ventricular size is normal. Tricuspid regurgitation signal is inadequate for assessing PA pressure. 2. 3. Left atrial size was mildly dilated. 4. Right atrial size was mildly dilated. The mitral valve is normal in structure. No evidence of mitral valve regurgitation. No evidence of mitral  stenosis. 5. The aortic valve is tricuspid. Aortic valve regurgitation is mild to moderate. No aortic stenosis is present. 6. 7. Aortic dilatation noted. There is dilatation of the aortic root, measuring 41 mm. The inferior vena cava is normal in size with greater than 50% respiratory variability, suggesting right atrial pressure of 3 mmHg.   Lipid Panel No results found for: "CHOL", "TRIG", "HDL", "CHOLHDL", "VLDL", "LDLCALC", "LDLDIRECT"    Wt Readings from Last 3 Encounters:  01/08/23 205 lb 3.2 oz (93.1 kg)  12/18/22 201 lb 9.6 oz (91.4 kg)  09/18/22 206 lb (93.4 kg)      ASSESSMENT AND PLAN:  `1  Dypsnea   Improved since increase in [place]   Reviewed with pt   He still wants to keep appt in Pulmonary   2  CAD  CA score of 1800  Myoview in Aug 2023 with no ischemia or infarct   Echo with LVEF normal     CP improved since last visit   ? If related to better BP control  ? Microvascular      Follow   3  HTN  Follow BP at home   A little high today   He says 110s to low 130s/ at home   Needs to take cuff to PCP or anther office to make sure it is accurate Call if high   Has room for increase in amlodipine   4  Palpitations   Pt denies    Has worn heart monitor   Just returned  Not back yet  5  HL   Pt is on Lipitor and Zetia  LDL 60  HDL 56      Current medicines are reviewed at length with the patient today.  The patient does not have concerns regarding medicines.  Signed, Dietrich PatesPaula Icess Bertoni, MD  01/08/2023 8:34 AM    [facility] [address], [place], [place]  [address] Phone: [phone]; Fax: [phone]

## 2023-01-08 NOTE — Patient Instructions (Signed)
Medication Instructions:   *If you need a refill on your cardiac medications before your next appointment, please call your pharmacy*   Lab Work:  If you have labs (blood work) drawn today and your tests are completely normal, you will receive your results only by: MyChart Message (if you have MyChart) OR A paper copy in the mail If you have any lab test that is abnormal or we need to change your treatment, we will call you to review the results.   Testing/Procedures:    Follow-Up: At [facility], you and your health needs are our priority.  As part of our continuing mission to provide you with exceptional heart care, we have created designated Provider Care Teams.  These Care Teams include your primary Cardiologist (physician) and Advanced Practice Providers (APPs -  Physician Assistants and Nurse Practitioners) who all work together to provide you with the care you need, when you need it.  We recommend signing up for the patient portal called "MyChart".  Sign up information is provided on this After Visit Summary.  MyChart is used to connect with patients for Virtual Visits (Telemedicine).  Patients are able to view lab/test results, encounter notes, upcoming appointments, etc.  Non-urgent messages can be sent to your provider as well.   To learn more about what you can do with MyChart, go to [email or web address].    Your next appointment:   6 month(s)  Provider:   Paula Ross, MD     Other Instructions   

## 2023-01-08 NOTE — Progress Notes (Addendum)
   See other note that is below  Dietrich PatesPaula Sabirin Baray MD

## 2023-01-14 ENCOUNTER — Telehealth: Payer: Self-pay | Admitting: Internal Medicine

## 2023-01-14 NOTE — Telephone Encounter (Signed)
Patient stated his symptoms are "about the same". The patient has been notified of the result and verbalized understanding.  All questions (if any) were answered. Asencion Gowda, LPN 0/03/6225 33:35 AM

## 2023-01-14 NOTE — Telephone Encounter (Signed)
Will forward to pts Primary Cardiologist Dr. Tenny Craw and Dr. Shari Prows (saw on DOD day) as an FYI. Will cc Dr. Charlott Rakes covering RN as well.

## 2023-01-14 NOTE — Telephone Encounter (Signed)
Monitor shows no significant arrhythmias

## 2023-01-14 NOTE — Telephone Encounter (Signed)
Patient called to follow-up on his test results.

## 2023-01-16 NOTE — Telephone Encounter (Signed)
Pt says he was feeling better but a few days ago his chest has been bothering him... and he notices it correlates with a pike in his BP... af ew readings:  165/68 147/67  Now in the 120-130 systolic the past day or so.   He will check his BP consistently for a few days and call us back with some readings.

## 2023-01-16 NOTE — Telephone Encounter (Signed)
Left a message for the pt to call back.  

## 2023-01-20 NOTE — Progress Notes (Unsigned)
Jacob [name], male    DOB: Aug 23, 1951    MRN: [id number]   Brief patient profile:  41  yowm  quit smoking 1984  referred to pulmonary clinic in [place]  01/21/2023 by Dr Jacob [name] for sob  and uneplained CP  Onset around 2005 of uri's to chest, worse in Spring but typically worse in spring but inhalers up to sev times a week better with abx/ predisone which also gets for back problems also last took feb 2024    History of Present Illness  01/21/2023  Pulmonary/ 1st office eval/ Jacob [name] / [name]  Chief Complaint  Patient presents with   Consult    Pt consult for DOE, he states that cardiologist done a holter monitor for 7 days  Dyspnea:  better with saba / still able to do heavy work  Cough: minimal vol up to  tsp > white unless having assoc head cold  Sleep: flat bed one pillow  SABA use: uses more saba with "cold"  02: none Cp x one year  lasts for days but typically wake up   Goes away for several weeks last time =  earlier this month Does not occur with exertion  Does seem to make him more sob but not sob at rest and no assoc diaphoresis or nausea.  Similar time frame has swelling L frank pain bulges up to 2 weeks then resolves.   No obvious other obviouspatterns in day to day or daytime  variability or assoc excess/ purulent sputum or mucus plugs or hemoptysis or chest tightness, subjective wheeze or overt sinus or hb symptoms.   Sleeping  without nocturnal  or early am exacerbation  of respiratory  c/o's or need for noct saba. Also denies any obvious fluctuation of symptoms with weather or environmental changes or other aggravating or alleviating factors except as outlined above   No unusual exposure hx or h/o childhood pna/ asthma or knowledge of premature birth.  Current Allergies, Complete Past Medical History, Past Surgical History, Family History, and Social History were reviewed in [employer] record.  ROS  The following are not active  complaints unless bolded Hoarseness, sore throat, dysphagia, dental problems, itching, sneezing,  nasal congestion or discharge of excess mucus or purulent secretions, ear ache,   fever, chills, sweats, unintended wt loss or wt gain, classically pleuritic or exertional cp,  orthopnea pnd or arm/hand swelling  or leg swelling, presyncope, palpitations, abdominal pain, anorexia, nausea, vomiting, diarrhea  or change in bowel habits or change in bladder habits, change in stools or change in urine, dysuria, hematuria,  rash, arthralgias, visual complaints, headache, numbness, weakness or ataxia or problems with walking or coordination,  change in mood or  memory.            Past Medical History:  Diagnosis Date   Chronic headaches    GERD (gastroesophageal reflux disease)    History of kidney stones    x1 episode   Hypercholesteremia    Hypercholesterolemia    Lumbar radiculopathy    lower back pain- both legs has numbness varies   Otitis media    Skin cancer of nose     Outpatient Medications Prior to Visit  Medication Sig Dispense Refill   albuterol (PROVENTIL HFA;VENTOLIN HFA) 108 (90 Base) MCG/ACT inhaler Inhale 1-2 puffs into the lungs every 6 (six) hours as needed for wheezing or shortness of breath. 1 Inhaler 0   amLODipine (NORVASC) 2.5 MG tablet Take 1 tablet (2.5  mg total) by mouth daily. 90 tablet 3   aspirin EC 81 MG tablet Take 1 tablet (81 mg total) by mouth daily. Swallow whole. 90 tablet 3   atorvastatin (LIPITOR) 40 MG tablet Take 40 mg by mouth daily.     docusate sodium (COLACE) 100 MG capsule Take 100 mg by mouth 2 (two) times daily as needed for mild constipation.     esomeprazole (NEXIUM) 40 MG capsule Take 1 capsule by mouth as needed (gerd).      ezetimibe (ZETIA) 10 MG tablet TAKE 1 TABLET(10 MG) BY MOUTH DAILY 90 tablet 2   fluticasone (FLONASE) 50 MCG/ACT nasal spray SHAKE LQ AND U 2 SPRAYS IEN QD  11   HYDROcodone-acetaminophen (NORCO) 10-325 MG tablet Take 1  tablet by mouth every 4 (four) hours as needed. pain  0   isosorbide mononitrate (IMDUR) 30 MG 24 hr tablet TAKE 1 TABLET(30 MG) BY MOUTH DAILY 90 tablet 2   meloxicam (MOBIC) 15 MG tablet Take 15 mg by mouth as needed.      oxyCODONE (OXYCONTIN) 20 MG 12 hr tablet Take 20 mg by mouth every 12 (twelve) hours.     promethazine (PHENERGAN) 25 MG tablet Take 25-50 mg by mouth every 8 (eight) hours as needed. nausea  5   valsartan (DIOVAN) 160 MG tablet Take 1 tablet (160 mg total) by mouth daily. 90 tablet 2   No facility-administered medications prior to visit.     Objective:     BP (!) 140/70   Pulse 72   Ht  (1.778 m)   Wt 203 lb 6.4 oz (92.3 kg)   SpO2 94% Comment: RA  BMI 29.18 kg/m   SpO2: 94 % (RA)   Amb wm nad    HEENT : Oropharynx  clear    NECK :  without  apparent JVD/ palpable Nodes/TM    LUNGS: no acc muscle use,  Nl contour chest which is clear to A and P bilaterally without cough on insp or exp maneuvers   CV:  RRR  no s3 or murmur or increase in P2, and no edema   ABD:  soft and nontender with nl inspiratory excursion in the supine position. No bruits or organomegaly appreciated   MS:  Nl gait/ ext warm without deformities Or obvious joint restrictions  calf tenderness, cyanosis or clubbing    SKIN: warm and dry without lesions    NEURO:  alert, approp, nl sensorium with  no motor or cerebellar deficits apparent.    CXR PA and Lateral:   01/21/2023 :    I personally reviewed images and impression is as follows:     Wnl        Assessment   DOE (dyspnea on exertion) Onset ? 2022 assoc with atypical CP  -  CT chest 06/27/21 on coronary study cuts including the L ant chest wall neg  - The proper method of use, as well as anticipated side effects, of a metered-dose inhaler were discussed and demonstrated to the patient using teach back method.   -  01/21/2023   Walked on RA  x  3  lap(s) =  approx 450  ft  @ mod fast pace, stopped due to end of  stuy  with lowest 02 sats 95% and no sob or cp    There is not real assoc with cp with sob or exertion at this point with nl walking study so no further w/u needed for now  He could still have component of asthma therefore rec  Check allergy screen Re SABA :  I spent extra time with pt today reviewing appropriate use of albuterol for prn use on exertion with the following points: 1) saba is for relief of sob that does not improve by walking a slower pace or resting but rather if the pt does not improve after trying this first. 2) If the pt is convinced, as many are, that saba helps recover from activity faster then it's easy to tell if this is the case by re-challenging : ie stop, take the inhaler, then p 5 minutes try the exact same activity (intensity of workload) that just caused the symptoms and see if they are substantially diminished or not after saba 3) if there is an activity that reproducibly causes the symptoms, try the saba 15 min before the activity on alternate days   If in fact the saba really does help, then fine to continue to use it prn but advised may need to look closer at the maintenance regimen being used to achieve better control of airways disease with exertion.   - The proper method of use, as well as anticipated side effects, of a metered-dose inhaler were discussed and demonstrated to the patient using teach back method.   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   F/u in 4-6 weeks   Each maintenance medication was reviewed in detail including emphasizing most importantly the difference between maintenance and prns and under what circumstances the prns are to be triggered using an action plan format where  appropriate.  Total time for H and P, chart review, counseling, reviewing hfa device(s) , directly observing portions of ambulatory 02 saturation study/ and generating customized AVS unique to this office visit / same day chartin > 60 min for multiple chronic or relapsing  refractory chest  symptoms of uncertain etiology with pt new to me                  Sandrea Hughs, MD 01/21/2023

## 2023-01-21 ENCOUNTER — Ambulatory Visit: Payer: Medicare Other | Admitting: Internal Medicine

## 2023-01-21 ENCOUNTER — Ambulatory Visit (HOSPITAL_COMMUNITY)
Admission: RE | Admit: 2023-01-21 | Discharge: 2023-01-21 | Disposition: A | Discharge status: Home / Self Care | Payer: Medicare Other | Source: Ambulatory Visit | Attending: Internal Medicine | Admitting: Internal Medicine

## 2023-01-21 ENCOUNTER — Encounter: Payer: Self-pay | Admitting: Internal Medicine

## 2023-01-21 VITALS — BP 140/70 | HR 72 | Ht 70.0 in | Wt 203.4 lb

## 2023-01-21 DIAGNOSIS — R0789 Other chest pain: Secondary | ICD-10-CM

## 2023-01-21 DIAGNOSIS — R0609 Other forms of dyspnea: Secondary | ICD-10-CM | POA: Diagnosis not present

## 2023-01-21 DIAGNOSIS — I10 Essential (primary) hypertension: Secondary | ICD-10-CM | POA: Diagnosis not present

## 2023-01-21 DIAGNOSIS — R079 Chest pain, unspecified: Secondary | ICD-10-CM | POA: Diagnosis not present

## 2023-01-21 DIAGNOSIS — R0600 Dyspnea, unspecified: Secondary | ICD-10-CM | POA: Diagnosis not present

## 2023-01-21 MED ORDER — FAMOTIDINE 20 MG PO TABS: ORAL_TABLET | ORAL | 11 refills | Status: DC

## 2023-01-21 MED ORDER — PANTOPRAZOLE SODIUM 40 MG PO TBEC: 40.0000 mg | DELAYED_RELEASE_TABLET | Freq: Every day | ORAL | 2 refills | Status: DC

## 2023-01-21 NOTE — Assessment & Plan Note (Addendum)
Onset ? 2022 assoc with atypical CP  -  CT chest 06/27/21 on coronary study cuts including the L ant chest wall neg  - The proper method of use, as well as anticipated side effects, of a metered-dose inhaler were discussed and demonstrated to the patient using teach back method.   -  01/21/2023   Walked on RA  x  3  lap(s) =  approx 450  ft  @ mod fast pace, stopped due to end of stuy  with lowest 02 sats 95% and no sob or cp    There is not real assoc with cp with sob or exertion at this point with nl walking study so no further w/u needed for now   He could still have component of asthma therefore rec  Check allergy screen Re SABA :  I spent extra time with pt today reviewing appropriate use of albuterol for prn use on exertion with the following points: 1) saba is for relief of sob that does not improve by walking a slower pace or resting but rather if the pt does not improve after trying this first. 2) If the pt is convinced, as many are, that saba helps recover from activity faster then it's easy to tell if this is the case by re-challenging : ie stop, take the inhaler, then p 5 minutes try the exact same activity (intensity of workload) that just caused the symptoms and see if they are substantially diminished or not after saba 3) if there is an activity that reproducibly causes the symptoms, try the saba 15 min before the activity on alternate days   If in fact the saba really does help, then fine to continue to use it prn but advised may need to look closer at the maintenance regimen being used to achieve better control of airways disease with exertion.   - The proper method of use, as well as anticipated side effects, of a metered-dose inhaler were discussed and demonstrated to the patient using teach back method.

## 2023-01-21 NOTE — Assessment & Plan Note (Addendum)
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   F/u in 4-6 weeks   Each maintenance medication was reviewed in detail including emphasizing most importantly the difference between maintenance and prns and under what circumstances the prns are to be triggered using an action plan format where appropriate.  Total time for H and P, chart review, counseling, reviewing hfa device(s) , directly observing portions of ambulatory 02 saturation study/ and generating customized AVS unique to this office visit / same day chartin > 60 min for multiple chronic or relapsing  refractory chest  symptoms of uncertain etiology with pt new to me

## 2023-01-21 NOTE — Patient Instructions (Addendum)
Only use your albuterol as a rescue medication to be used if you can't catch your breath by resting or doing a relaxed purse lip breathing pattern.  - The less you use it, the better it will work when you need it. - Ok to use up to 2 puffs  every 4 hours if you must but call for immediate appointment if use goes up over your usual need - Don't leave home without it !!  (think of it like the spare tire for your car)   Also  Ok to try albuterol 15 min before an activity (on alternating days like starter fluid)  that you know would usually make you short of breath and see if it makes any difference and if makes none then don't take albuterol after activity unless you can't catch your breath as this means it's the resting that helps, not the albuterol.  Pantoprazole (protonix) 40 mg   Take  30-60 min before first meal of the day and Pepcid (famotidine)  20 mg after supper until return to office - this is the best way to tell whether stomach acid is contributing to your problem.    Treatment consists of avoiding foods that cause gas (especially boiled eggs, mexcican food but especially  beans and undercooked vegetables like  spinach and some salads)  and citrucel 1 heaping tsp twice daily with a large glass of water until you return to office  Please remember to go to the lab department   for your tests - we will call you with the results when they are available.      Please remember to go to the  x-ray department  @  [facility] for your tests - we will call you with the results when they are available     Please schedule a follow up office visit in 4 weeks, sooner if needed  with all medications /inhalers/ solutions in hand so we can verify exactly what you are taking. This includes all medications from all doctors and over the counters

## 2023-01-22 NOTE — Telephone Encounter (Signed)
Bp readings  4/17: morning - 146/72 hr 79           Evening - 122/61 hr 80  4/18: Morning - 128/61 hr82           Evening - 125/60 hr 67   4/19: morning - 135/62 hr 75  4/20: morning - 128/65 hr65   4/21: morning - 116/64 hr 57  4/22: morning - 129/63 hr 60   4/23: 12:00pm - 149/66 hr 69

## 2023-01-22 NOTE — Telephone Encounter (Signed)
Left message for patient informing him we have received his BP readings and will forward to Dr. Tenny Craw to review.  Provided office number for callback if any questions.

## 2023-01-23 NOTE — Telephone Encounter (Signed)
Overall BP readings look pretty good  Keep on same meds

## 2023-01-24 DIAGNOSIS — G894 Chronic pain syndrome: Secondary | ICD-10-CM | POA: Diagnosis not present

## 2023-01-24 DIAGNOSIS — M542 Cervicalgia: Secondary | ICD-10-CM | POA: Diagnosis not present

## 2023-01-24 NOTE — Telephone Encounter (Signed)
Left a detailed message for the pt per his DPR.

## 2023-01-26 LAB — CBC WITH DIFFERENTIAL/PLATELET
Basophils Absolute: 0.1 10*3/uL (ref 0.0–0.2)
Basos: 2 %
EOS (ABSOLUTE): 0.7 10*3/uL — ABNORMAL HIGH (ref 0.0–0.4)
Eos: 12 %
Hematocrit: 40.6 % (ref 37.5–51.0)
Hemoglobin: 14 g/dL (ref 13.0–17.7)
Immature Grans (Abs): 0 10*3/uL (ref 0.0–0.1)
Immature Granulocytes: 0 %
Lymphocytes Absolute: 1.4 10*3/uL (ref 0.7–3.1)
Lymphs: 23 %
MCH: 31.7 pg (ref 26.6–33.0)
MCHC: 34.5 g/dL (ref 31.5–35.7)
MCV: 92 fL (ref 79–97)
Monocytes Absolute: 0.6 10*3/uL (ref 0.1–0.9)
Monocytes: 9 %
Neutrophils Absolute: 3.2 10*3/uL (ref 1.4–7.0)
Neutrophils: 54 %
Platelets: 237 10*3/uL (ref 150–450)
RBC: 4.41 x10E6/uL (ref 4.14–5.80)
RDW: 13 % (ref 11.6–15.4)
WBC: 6 10*3/uL (ref 3.4–10.8)

## 2023-01-26 LAB — IGE: IgE (Immunoglobulin E), Serum: 91 IU/mL (ref 6–495)

## 2023-01-26 LAB — TSH: TSH: 1.59 u[IU]/mL (ref 0.450–4.500)

## 2023-01-31 ENCOUNTER — Telehealth: Payer: Self-pay | Admitting: *Deleted

## 2023-01-31 DIAGNOSIS — Z9109 Other allergy status, other than to drugs and biological substances: Secondary | ICD-10-CM

## 2023-01-31 NOTE — Telephone Encounter (Signed)
ATC pt LVM for him to call office back.  

## 2023-01-31 NOTE — Progress Notes (Signed)
Called pt and there was no answer-LMTCB °

## 2023-01-31 NOTE — Telephone Encounter (Signed)
Patient returning a call to go over results. Please call patient back [phone]

## 2023-02-04 MED ORDER — BUDESONIDE-FORMOTEROL FUMARATE 80-4.5 MCG/ACT IN AERO: 2.0000 | INHALATION_SPRAY | Freq: Two times a day (BID) | RESPIRATORY_TRACT | 12 refills | Status: DC

## 2023-02-04 NOTE — Telephone Encounter (Signed)
  Jacob Cowden, MD 01/21/2023  4:01 PM EDT     Call pt:  Reviewed cxr and no acute change so no change in recommendations made at Sylvie Farrier, MD 01/27/2023  6:23 AM EDT     Call patient :  Studies are suggestive of allergy which means some of his symptoms may be asthma - if the albuterol I rec is helping he should change rx and add daily symicort 80 2 bid as maintenance rx and f/u with me or do allergy consult next step > Dr Dellis Anes    Patient is aware of results and voiced his understanding.  Symbicort sent to preferred pharmacy.  Pending appt with Dr. Sherene Sires 03/05/2023. Allergy referral placed. Nothing further needed.

## 2023-02-04 NOTE — Addendum Note (Signed)
Addended by: Lajoyce Lauber A on: 02/04/2023 10:53 AM   Modules accepted: Orders

## 2023-02-05 ENCOUNTER — Other Ambulatory Visit: Payer: Self-pay | Admitting: *Deleted

## 2023-02-05 MED ORDER — BUDESONIDE-FORMOTEROL FUMARATE 80-4.5 MCG/ACT IN AERO: 2.0000 | INHALATION_SPRAY | Freq: Two times a day (BID) | RESPIRATORY_TRACT | 12 refills | Status: DC

## 2023-02-05 NOTE — Progress Notes (Signed)
Called and spoke with patient, advised of results/recommendations per Dr. Sherene Sires.  He verbalized understanding.  Verified pharmacy and prescription sent to preferred pharmacy.  Nothing further needed.

## 2023-02-21 DIAGNOSIS — G894 Chronic pain syndrome: Secondary | ICD-10-CM | POA: Diagnosis not present

## 2023-02-21 DIAGNOSIS — M5106 Intervertebral disc disorders with myelopathy, lumbar region: Secondary | ICD-10-CM | POA: Diagnosis not present

## 2023-03-04 NOTE — Progress Notes (Unsigned)
Jacob [name], male    DOB: 1951/03/22    MRN: [id number]   Brief patient profile:  7  yowm  quit smoking 1984  referred to pulmonary clinic in [place]  01/21/2023 by Dr Jacob [name] for sob  and uneplained CP  Onset around 2005 of uri's to chest, worse in Spring but typically worse in spring but inhalers up to sev times a week better with abx/ predisone which also gets for back problems also last took feb 2024    History of Present Illness  01/21/2023  Pulmonary/ 1st office eval/ [name] / [name]  Chief Complaint  Patient presents with   Consult    Pt consult for DOE, he states that cardiologist done a holter monitor for 7 days  Dyspnea:  better with saba / still able to do heavy work  Cough: minimal vol up to  tsp > white unless having assoc head cold  Sleep: flat bed one pillow  SABA use: uses more saba with "cold"  02: none Cp x one year  lasts for days but typically wake up   Goes away for several weeks last time =  earlier this month Does not occur with exertion  Does seem to make him more sob but not sob at rest and no assoc diaphoresis or nausea.  Similar time frame has swelling L frank pain bulges up to 2 weeks then resolves.  Rec Only use your albuterol as a rescue medication  Also  Ok to try albuterol 15 min before an activity (on alternating days like starter fluid)  that you know would usually make you short of breath Pantoprazole (protonix) 40 mg   Take  30-60 min before first meal of the day and Pepcid (famotidine)  20 mg after supper until return to office  IBS diet and citrucel   Allergy screen 01/21/2023 >  Eos 0.6/  IgE  91.      Please remember to go to the  x-ray department  @  [name] for your tests - we will call you with the results when they are available     Please schedule a follow up office visit in 4 weeks, sooner if needed  with all medications /inhalers/ solutions in hand so we can verify exactly what you are taking. This  includes all medications from all doctors and over the counters    03/05/2023  f/u ov/Jacob [name] office/[facility]ob [name] re: *** maint on ***  No chief complaint on file.   Dyspnea:  *** Cough: *** Sleeping: *** SABA use: *** 02: *** Covid status: *** Lung cancer screening: ***   No obvious day to day or daytime variability or assoc excess/ purulent sputum or mucus plugs or hemoptysis or cp or chest tightness, subjective wheeze or overt sinus or hb symptoms.   *** without nocturnal  or early am exacerbation  of respiratory  c/o's or need for noct saba. Also denies any obvious fluctuation of symptoms with weather or environmental changes or other aggravating or alleviating factors except as outlined above   No unusual exposure hx or h/o childhood pna/ asthma or knowledge of premature birth.  Current Allergies, Complete Past Medical History, Past Surgical History, Family History, and Social History were reviewed in [employer] record.  ROS  The following are not active complaints unless bolded Hoarseness, sore throat, dysphagia, dental problems, itching, sneezing,  nasal congestion or discharge of excess mucus or purulent secretions, ear ache,   fever, chills, sweats, unintended  wt loss or wt gain, classically pleuritic or exertional cp,  orthopnea pnd or arm/hand swelling  or leg swelling, presyncope, palpitations, abdominal pain, anorexia, nausea, vomiting, diarrhea  or change in bowel habits or change in bladder habits, change in stools or change in urine, dysuria, hematuria,  rash, arthralgias, visual complaints, headache, numbness, weakness or ataxia or problems with walking or coordination,  change in mood or  memory.        No outpatient medications have been marked as taking for the 03/05/23 encounter (Appointment) with Jacob Cowden, MD.           Past Medical History:  Diagnosis Date   Chronic headaches    GERD (gastroesophageal reflux disease)    History of  kidney stones    x1 episode   Hypercholesteremia    Hypercholesterolemia    Lumbar radiculopathy    lower back pain- both legs has numbness varies   Otitis media    Skin cancer of nose         Objective:      Wt Readings from Last 3 Encounters:  01/21/23 203 lb 6.4 oz (92.3 kg)  01/08/23 205 lb 3.2 oz (93.1 kg)  12/18/22 201 lb 9.6 oz (91.4 kg)      Vital signs reviewed  03/05/2023  - Note at rest 02 sats  ***% on ***   General appearance:    ***            Assessment

## 2023-03-05 ENCOUNTER — Ambulatory Visit: Payer: Medicare Other | Admitting: Internal Medicine

## 2023-03-05 ENCOUNTER — Encounter: Payer: Self-pay | Admitting: Internal Medicine

## 2023-03-05 VITALS — BP 134/56 | HR 62 | Ht 70.0 in | Wt 203.2 lb

## 2023-03-05 DIAGNOSIS — R0609 Other forms of dyspnea: Secondary | ICD-10-CM

## 2023-03-05 DIAGNOSIS — R0789 Other chest pain: Secondary | ICD-10-CM

## 2023-03-05 LAB — POCT EXHALED NITRIC OXIDE: FeNO level (ppb): 17

## 2023-03-05 NOTE — Assessment & Plan Note (Signed)
Onset ? 2022 assoc with atypical CP  -  CT chest 06/27/21 on coronary study cuts including the L ant chest wall > WNL - The proper method of use, as well as anticipated side effects, of a metered-dose inhaler were discussed and demonstrated to the patient using teach back method.   -  01/21/2023   Walked on RA  x  3  lap(s) =  approx 450  ft  @ mod fast pace, stopped due to end of study  with lowest 02 sats 95% and no sob or cp   - Allergy screen 01/21/2023 >  Eos 0.6/  IgE  91 rec symbicort 80 but no better so try off again 03/05/2023  - FENO 03/05/2023   17 p no symbicort > 12 h  - 03/05/2023 rec reg submax ex building up to 30 min tiw and if not better p 6 weeks >CPST next  Strongly doubt EIA or other forms of asthma but hard to r/o so rec above with f/u cpst but in meantime:  Re SABA :  I spent extra time with pt today reviewing appropriate use of albuterol for prn use on exertion with the following points: 1) saba is for relief of sob that does not improve by walking a slower pace or resting but rather if the pt does not improve after trying this first. 2) If the pt is convinced, as many are, that saba helps recover from activity faster then it's easy to tell if this is the case by re-challenging : ie stop, take the inhaler, then p 5 minutes try the exact same activity (intensity of workload) that just caused the symptoms and see if they are substantially diminished or not after saba 3) if there is an activity that reproducibly causes the symptoms, try the saba 15 min before the activity on alternate days   If in fact the saba really does help, then fine to continue to use it prn but advised may need to look closer at the maintenance regimen being used to achieve better control of airways disease with exertion.

## 2023-03-05 NOTE — Assessment & Plan Note (Signed)
Better with GERD / ibs diet plus ppi/ h2hs 03/05/2023 so continue for 3 months then back off again to see if flares          Each maintenance medication was reviewed in detail including emphasizing most importantly the difference between maintenance and prns and under what circumstances the prns are to be triggered using an action plan format where appropriate.  Total time for H and P, chart review, counseling, reviewing hfa  device(s) and generating customized AVS unique to this office visit / same day charting > 30 min for multiple  refractory respiratory/chest   symptoms of uncertain etiology

## 2023-03-05 NOTE — Patient Instructions (Addendum)
Ok to try off symbicort since you feel it's not working - restart if worse.  FENO today to screen you for allergic asthma  Ok to try albuterol 15 min before an activity (on alternating days)  that you know would usually make you short of breath and see if it makes any difference and if makes none then don't take albuterol after activity unless you can't catch your breath as this means it's the resting that helps, not the albuterol.      To get the most out of exercise, you need to be continuously aware that you are short of breath, but never out of breath, for at least 30 minutes at least 3 x weekly. As you improve, it will actually be easier for you to do the same amount of exercise  in  30 minutes so always push to the level where you are short of breath.     If you this for 6 weeks you should improve to your satisfaction - if not call me to schedule CPST - don't use any inhalers the day of this test

## 2023-03-21 DIAGNOSIS — M5106 Intervertebral disc disorders with myelopathy, lumbar region: Secondary | ICD-10-CM | POA: Diagnosis not present

## 2023-03-21 DIAGNOSIS — G894 Chronic pain syndrome: Secondary | ICD-10-CM | POA: Diagnosis not present

## 2023-03-21 DIAGNOSIS — M542 Cervicalgia: Secondary | ICD-10-CM | POA: Diagnosis not present

## 2023-03-22 IMAGING — US US ABDOMEN LIMITED
1 series · 6 of 6 positions shown · non-contrast
Comparison: None Available.

CLINICAL DATA: Palpable lump.

EXAM:
ULTRASOUND ABDOMEN LIMITED

[Series 1: us abdomen limited · 0.19mm/px · 6 of 6 slices shown]
[im 1/6]
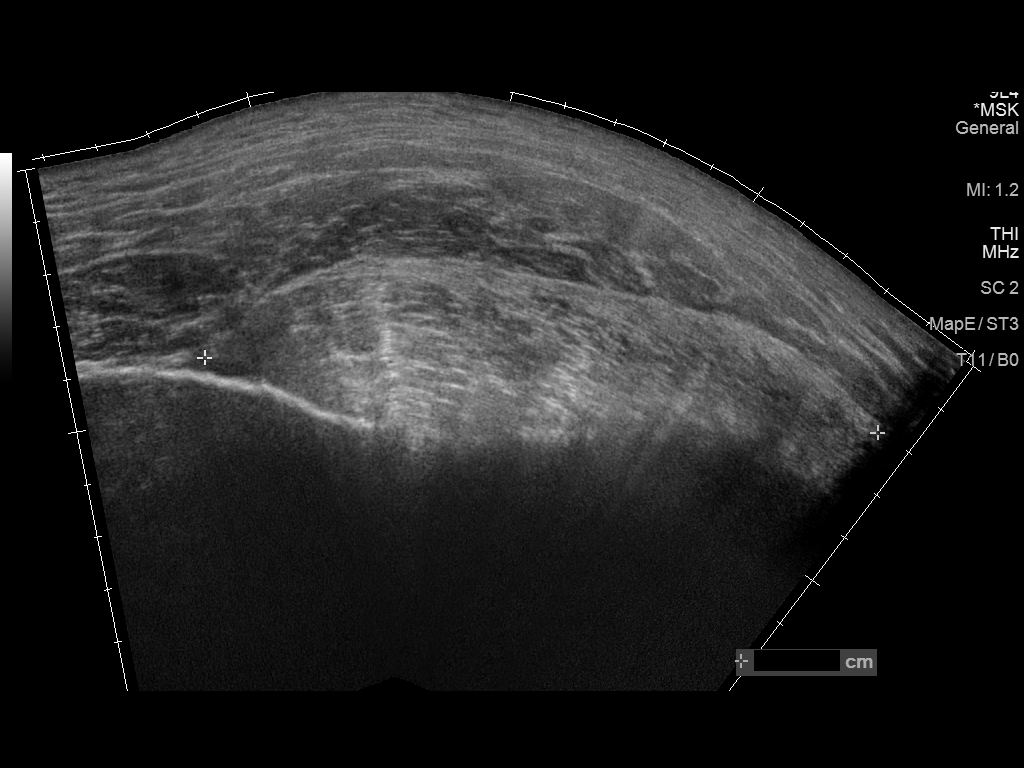
[im 2/6]
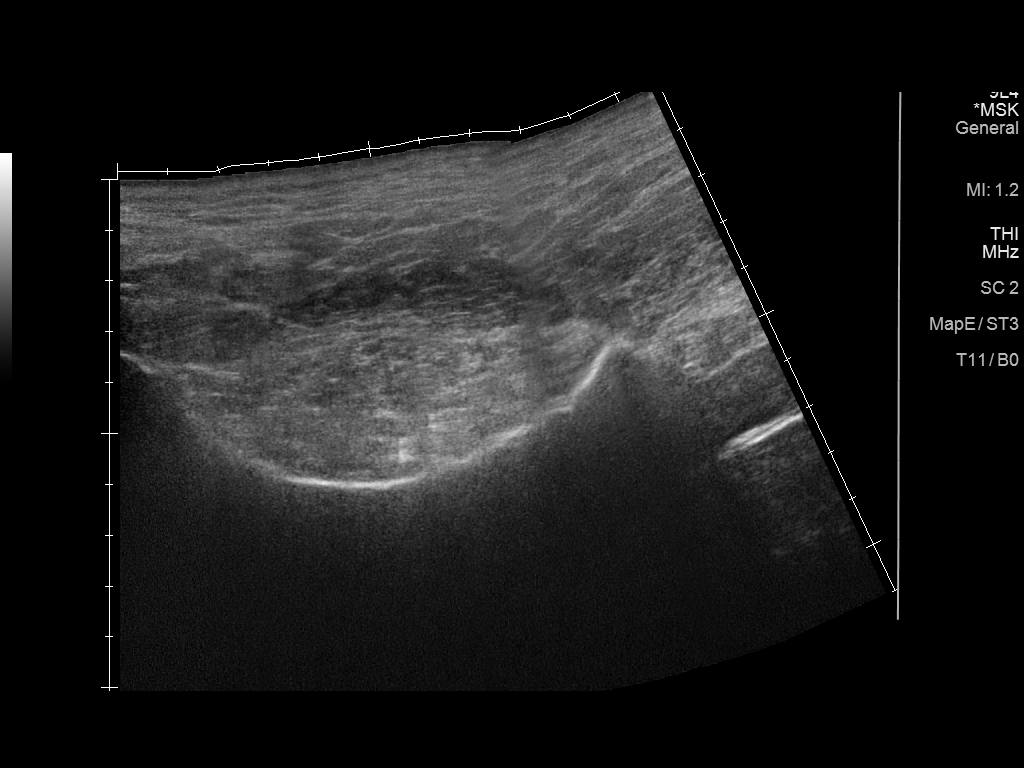
[im 3/6]
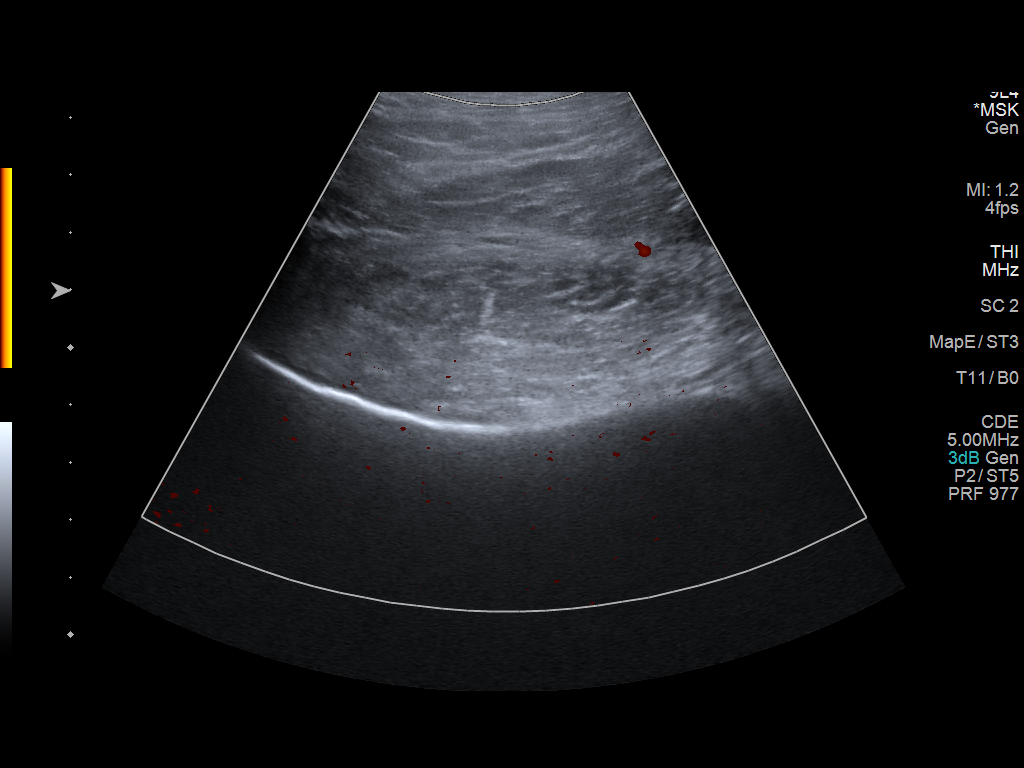
[im 4/6]
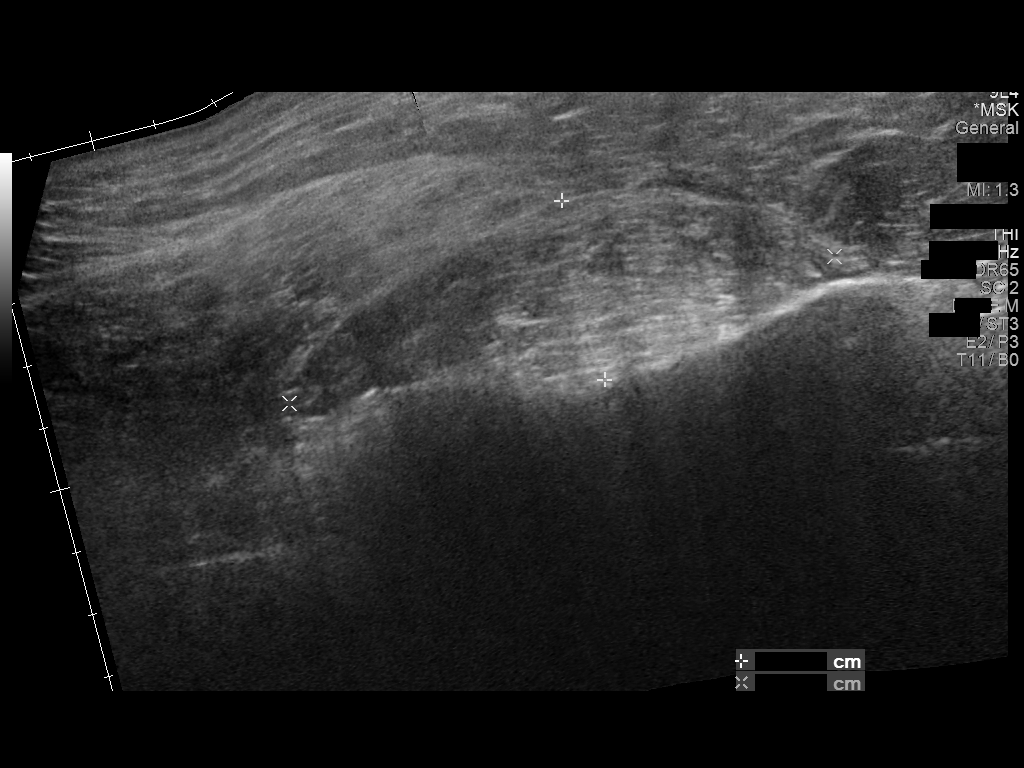
[im 5/6]
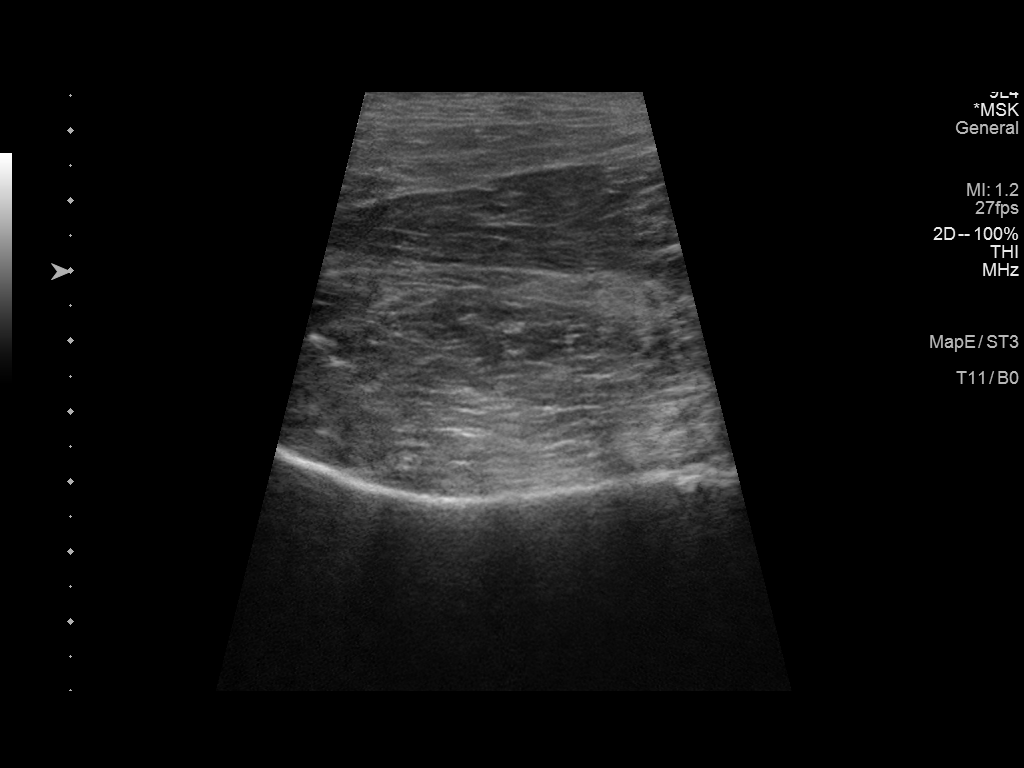
[im 6/6]
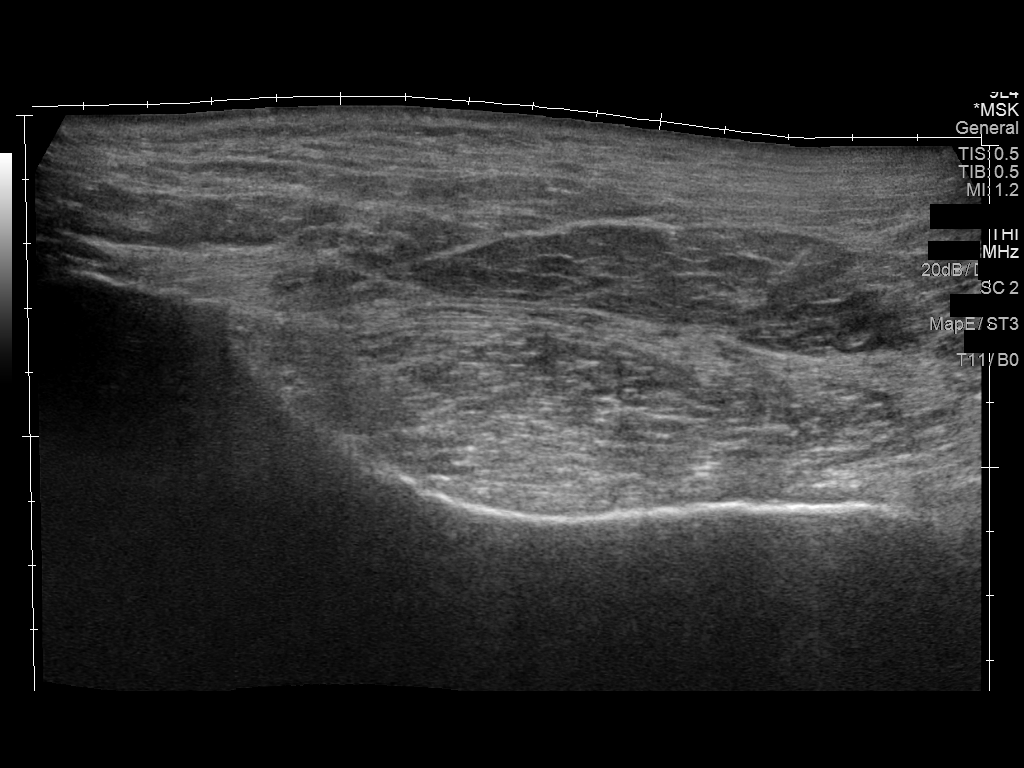

[6 of 6 positions shown; findings below may reference images not displayed]

FINDINGS: There is a 12.7 x 2.9 x 8.8 cm mass in the region of the patient's
palpable lump. Mass demonstrates heterogeneous echotexture and is
indeterminate.
IMPRESSION: Indeterminate palpable mass measuring 12.7 x 2.9 x 8.8 cm in the
region of the patient's palpable lump. The mass was not identified
on the CT scan from May 22, 2016. Given the size and interval
growth of the mass, recommend MRI for complete characterization.

These results will be called to the ordering clinician or
representative by the Radiologist Assistant, and communication
documented in the PACS or [REDACTED].

## 2023-04-07 ENCOUNTER — Other Ambulatory Visit: Payer: Self-pay | Admitting: Internal Medicine

## 2023-04-18 DIAGNOSIS — G894 Chronic pain syndrome: Secondary | ICD-10-CM | POA: Diagnosis not present

## 2023-04-18 DIAGNOSIS — M5106 Intervertebral disc disorders with myelopathy, lumbar region: Secondary | ICD-10-CM | POA: Diagnosis not present

## 2023-04-18 DIAGNOSIS — M542 Cervicalgia: Secondary | ICD-10-CM | POA: Diagnosis not present

## 2023-04-18 DIAGNOSIS — I10 Essential (primary) hypertension: Secondary | ICD-10-CM | POA: Diagnosis not present

## 2023-05-14 ENCOUNTER — Other Ambulatory Visit: Payer: Self-pay | Admitting: Internal Medicine

## 2023-05-14 DIAGNOSIS — M5106 Intervertebral disc disorders with myelopathy, lumbar region: Secondary | ICD-10-CM | POA: Diagnosis not present

## 2023-05-14 DIAGNOSIS — G894 Chronic pain syndrome: Secondary | ICD-10-CM | POA: Diagnosis not present

## 2023-05-15 DIAGNOSIS — Z0000 Encounter for general adult medical examination without abnormal findings: Secondary | ICD-10-CM | POA: Diagnosis not present

## 2023-05-15 DIAGNOSIS — E7800 Pure hypercholesterolemia, unspecified: Secondary | ICD-10-CM | POA: Diagnosis not present

## 2023-05-15 DIAGNOSIS — G5601 Carpal tunnel syndrome, right upper limb: Secondary | ICD-10-CM | POA: Diagnosis not present

## 2023-05-15 DIAGNOSIS — G8929 Other chronic pain: Secondary | ICD-10-CM | POA: Diagnosis not present

## 2023-05-15 DIAGNOSIS — I77810 Thoracic aortic ectasia: Secondary | ICD-10-CM | POA: Diagnosis not present

## 2023-05-15 DIAGNOSIS — G43009 Migraine without aura, not intractable, without status migrainosus: Secondary | ICD-10-CM | POA: Diagnosis not present

## 2023-05-15 DIAGNOSIS — I10 Essential (primary) hypertension: Secondary | ICD-10-CM | POA: Diagnosis not present

## 2023-05-15 DIAGNOSIS — M25511 Pain in right shoulder: Secondary | ICD-10-CM | POA: Diagnosis not present

## 2023-05-15 DIAGNOSIS — J45909 Unspecified asthma, uncomplicated: Secondary | ICD-10-CM | POA: Diagnosis not present

## 2023-05-15 DIAGNOSIS — R7303 Prediabetes: Secondary | ICD-10-CM | POA: Diagnosis not present

## 2023-05-15 DIAGNOSIS — I25119 Atherosclerotic heart disease of native coronary artery with unspecified angina pectoris: Secondary | ICD-10-CM | POA: Diagnosis not present

## 2023-06-14 ENCOUNTER — Ambulatory Visit: Payer: Medicare Other | Admitting: Allergy & Immunology

## 2023-06-24 DIAGNOSIS — G894 Chronic pain syndrome: Secondary | ICD-10-CM | POA: Diagnosis not present

## 2023-06-24 DIAGNOSIS — Z79899 Other long term (current) drug therapy: Secondary | ICD-10-CM | POA: Diagnosis not present

## 2023-06-24 DIAGNOSIS — M5106 Intervertebral disc disorders with myelopathy, lumbar region: Secondary | ICD-10-CM | POA: Diagnosis not present

## 2023-06-24 DIAGNOSIS — Z79891 Long term (current) use of opiate analgesic: Secondary | ICD-10-CM | POA: Diagnosis not present

## 2023-07-10 ENCOUNTER — Encounter: Payer: Self-pay | Admitting: Allergy & Immunology

## 2023-07-10 ENCOUNTER — Other Ambulatory Visit: Payer: Self-pay

## 2023-07-10 ENCOUNTER — Ambulatory Visit: Payer: Medicare Other | Admitting: Allergy & Immunology

## 2023-07-10 VITALS — BP 130/70 | HR 70 | Temp 98.4°F | Ht 70.0 in | Wt 191.0 lb

## 2023-07-10 DIAGNOSIS — J4520 Mild intermittent asthma, uncomplicated: Secondary | ICD-10-CM | POA: Diagnosis not present

## 2023-07-10 DIAGNOSIS — B999 Unspecified infectious disease: Secondary | ICD-10-CM | POA: Diagnosis not present

## 2023-07-10 DIAGNOSIS — K219 Gastro-esophageal reflux disease without esophagitis: Secondary | ICD-10-CM | POA: Diagnosis not present

## 2023-07-10 DIAGNOSIS — J310 Chronic rhinitis: Secondary | ICD-10-CM

## 2023-07-10 NOTE — Progress Notes (Unsigned)
Cardiology Office Note   Date:  07/10/2023   ID:  Franko, Hilliker 1951/07/25, MRN [id number]  PCP:  Georgann Housekeeper, MD  Cardiologist:   Dietrich Pates, MD   Follow up of CAD and HTN and SOB      History of Present Illness: Jacob [name] is a 72 y.o. male with a history ofhistory of HL, atypical CP and dyspnea on exertion   Myoview in 2019 was normal    I saw him in Sept 2022   Went on to have a CT coronary angio that showed Ca score of approx 1800   Mild to mod CAD on CT  Myoview in Aug 2023 showed no ischemia or infarct     I saw the pt earlier this winter   He was seen by Jon Billings on 12/18/22 while I was out of town.   Cpmplained of SOB x 6 mon  Also noted some fluttering in chest that was not related to exertion   Also intermitt CP on L    Valsartan increased to 160 mg    Patient says after being seen by Jon Billings and increasing meds he has had only one spell of CP   Also says his breathing is improved     Since seen he walks a little    I saw the pt in April 2024    No outpatient medications have been marked as taking for the 07/11/23 encounter (Appointment) with Pricilla Riffle, MD.     Allergies:   Patient has no known allergies.   Past Medical History:  Diagnosis Date   Chronic headaches    GERD (gastroesophageal reflux disease)    History of kidney stones    x1 episode   Hypercholesteremia    Hypercholesterolemia    Lumbar radiculopathy    lower back pain- both legs has numbness varies   Otitis media    Recurrent upper respiratory infection (URI)    Skin cancer of nose     Past Surgical History:  Procedure Laterality Date   BACK SURGERY     laminectomy-lumbar x3   COLONOSCOPY WITH PROPOFOL N/A 11/28/2015   Procedure: COLONOSCOPY WITH PROPOFOL;  Surgeon: Charolett Bumpers, MD;  Location: [facility];  Service: Endoscopy;  Laterality: N/A;   INGUINAL HERNIA REPAIR Right 05/18/2013   Procedure: RIGHT INGUINAL HERNIA REPAIR ADULT;  Surgeon: Marlane Hatcher, MD;  Location: [facility];  Service: General;  Laterality: Right;  (no mesh)   SKIN CANCER EXCISION     nose   VASECTOMY       Social History:  The patient  reports that he quit smoking about 40 years ago. His smoking use included cigarettes. He started smoking about 45 years ago. He has a 5 pack-year smoking history. He has been exposed to tobacco smoke. He has never used smokeless tobacco. He reports that he does not drink alcohol and does not use drugs.   Family History:  The patient's family history includes CAD in his mother.    ROS:  Please see the history of present illness. All other systems are reviewed and  Negative to the above problem except as noted.    PHYSICAL EXAM: VS:  There were no vitals taken for this visit.  GEN: Well nourished, well developed, in no acute distress  HEENT: normal  Neck: no JVD, carotid bruit Cardiac: RRR; no murmur  No LE  edema  Respiratory:  clear to auscultation bilaterally GI:  soft, nontender, nondistended  No hepatomegaly    EKG:  EKG is not ordered today.  Myoview AUg    CT coronary angiogram    Sept 2022   IMPRESSION: 1. Coronary calcium score of 1807. This was 95th percentile for age and sex matched control.   2. Normal coronary origin with right dominance.   3. Nonobstructive CAD   4. Calcified plaque in the left main causes minimal (0-24%) stenosis   5. Mixed plaque in the proximal LAD causes mild (25-49%) stenosis, with high risk plaque features including positive remodeling, low attenuation plaque and napkin ring sign. There is calcified plaque in the mid LAD causing minimal (0-24%) stenosis. There is calcified plaque in ostial D1 causing mild (25-49%) stenosis.   6. Calcified plaque caused mild (25-49%) stenosis in the proximal and mid RCA, and causes minimal (0-24%) stenosis in the distal RCA.   7. Calcified plaque in the proximal LCX causes minimal (0-24%) stenosis   8. Dilated ascending aorta measuring  40mm   CAD-RADS 2. Mild non-obstructive CAD (25-49%). Consider non-atherosclerotic causes of chest pain. Consider preventive therapy and risk factor modification.   Echo October 2022   Left ventricular ejection fraction, by estimation, is 60 to 65%. The left ventricle has normal function. The left ventricle has no regional wall motion abnormalities. There is mild left ventricular hypertrophy. Left ventricular diastolic parameters were normal. 1. Right ventricular systolic function is normal. The right ventricular size is normal. Tricuspid regurgitation signal is inadequate for assessing PA pressure. 2. 3. Left atrial size was mildly dilated. 4. Right atrial size was mildly dilated. The mitral valve is normal in structure. No evidence of mitral valve regurgitation. No evidence of mitral stenosis. 5. The aortic valve is tricuspid. Aortic valve regurgitation is mild to moderate. No aortic stenosis is present. 6. 7. Aortic dilatation noted. There is dilatation of the aortic root, measuring 41 mm. The inferior vena cava is normal in size with greater than 50% respiratory variability, suggesting right atrial pressure of 3 mmHg.   Lipid Panel No results found for: "CHOL", "TRIG", "HDL", "CHOLHDL", "VLDL", "LDLCALC", "LDLDIRECT"    Wt Readings from Last 3 Encounters:  07/10/23 191 lb (86.6 kg)  03/05/23 203 lb 3.2 oz (92.2 kg)  01/21/23 203 lb 6.4 oz (92.3 kg)      ASSESSMENT AND PLAN:  `1  Dypsnea   Improved since increase in [place]   Reviewed with pt   He still wants to keep appt in Pulmonary   2  CAD  CA score of 1800  Myoview in Aug 2023 with no ischemia or infarct   Echo with LVEF normal     CP improved since last visit   ? If related to better BP control  ? Microvascular      Follow   3  HTN  Follow BP at home   A little high today   He says 110s to low 130s/ at home   Needs to take cuff to PCP or anther office to make sure it is accurate Call if high   Has room for increase  in amlodipine   4  Palpitations   Pt denies    Has worn heart monitor   Just returned  Not back yet  5  HL   Pt is on Lipitor and Zetia  LDL 60  HDL 56      Current medicines are reviewed at length with the patient today.  The patient does not have concerns regarding  medicines.  Signed, Dietrich Pates, MD  07/10/2023 3:08 PM    [facility] [address] [place], [place], [place]  [address] Phone: [phone]; Fax: [phone]

## 2023-07-10 NOTE — Patient Instructions (Addendum)
1. Mild intermittent asthma, uncomplicated - Lung testing looks great today. - Given your lack of improvement with the Symbicort, I am not sure that this is asthma. - I would continue with the use of albuterol as needed like you are doing.   2. Chronic rhinitis - We will do testing on Friday morning at 8:30am since your insurance company does not cover the visit AND testing on the same day. - We will know more at that time.  3. Return in about 2 days (around 07/12/2023).    Please inform us of any Emergency Department visits, hospitalizations, or changes in symptoms. Call us before going to the ED for breathing or allergy symptoms since we might be able to fit you in for a sick visit. Feel free to contact us anytime with any questions, problems, or concerns.  It was a pleasure to meet you today!  Websites that have reliable patient information: 1. American Academy of Asthma, Allergy, and Immunology: www.aaaai.org 2. Food Allergy Research and Education (FARE): foodallergy.org 3. Mothers of Asthmatics: http://www.asthmacommunitynetwork.org 4. American College of Allergy, Asthma, and Immunology: www.acaai.org   COVID-19 Vaccine Information can be found at: PodExchange.nl For questions related to vaccine distribution or appointments, please email [email or web address] or call [phone].     "Like" [place] on Facebook and Instagram for our latest updates!      A healthy democracy works best when [employer] participate! Make sure you are registered to vote! If you have moved or changed any of your contact information, you will need to get this updated before voting! Scan the QR codes below to learn more!

## 2023-07-10 NOTE — Progress Notes (Signed)
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 Mild intermittent asthma, uncomplicated - Lung testing looks great today. - Given your lack of improvement with the Symbicort, I am not sure that this is asthma. - I would continue with the use of albuterol as needed like you are doing.   2. Chronic rhinitis - We will do testing on Friday morning at 8:30am since your insurance company does not cover the visit AND testing on the same day. - We will know more at that time.  3. Return in about 2 days (around 07/12/2023).    This note in its entirety was forwarded to the Provider who requested this consultation.  Subjective:   FELIPE PALUCH is a 72 y.o. male presenting today for evaluation of  Chief Complaint  Patient presents with   Nasal Congestion   Breathing Problem    C/o of some chest pain shortness and chest tightness    GARNIE BORCHARDT has a history of the following: Patient Active Problem List   Diagnosis Date Noted   DOE (dyspnea on exertion) 04/12/2016   Atypical chest pain 04/12/2016    History obtained from: chart review and patient.  Discussed the use of AI scribe software for clinical note transcription with the patient and/or guardian, who gave verbal consent to proceed.  Teressa Senter was referred by Georgann Housekeeper, MD.     Zaiyden is a 72 y.o. male presenting for an evaluation of environmental allergies .  Asthma/Respiratory Symptom History: He has no confirmed diagnosis of asthma.  He has seen cardiology and pulmonology in the past for evaluation of chest pain.  The cause of the symptoms remains undetermined. The patient  has been using an albuterol nebulizer for several years, prescribed by his primary care physician, for recurrent bronchitis and sinus problems. The frequency of use is approximately twice a month, with increased usage during certain seasons. The patient also reports frequent headaches and sinus infections, which often lead to chest complications. A trial of Symbicort was initiated by his pulmonologist, but was discontinued due to lack of improvement. The patient has a remote history of smoking, approximately 40-50 years ago.   His last appointment with Dr. Sherene Sires was in June 2024.  At that time, they discussed how and when to use his albuterol.  He did discontinue his Symbicort because it did not seem to help.  He had undergone a walking test which resulted in a lowest sat of 95% after 500 feet at a moderate pace.  He has had an absolute eosinophil count of 600 in April 2024 and an IgE level of 91.  Allergic Rhinitis Symptom History: The patient denies any food allergies or rashes, but reports occasional itchy eyes.  She does have a lot of postnasal drip and throat clearing.  He has never been allergy tested in the past.  He grew up in [place] but now lives in [place].  His symptoms never seem to change at home versus at work.  He is now retired, worked in Herbalist in various factory settings.  The patient has been prescribed antibiotics one to two times annually for the past two years, typically when  bronchitis symptoms become severe. He also reports occasional ear pain, but denies any history of pneumonia, bone infections, or intestinal infections.  He does feel that he gets sick more often than others, although a lot of these are just viral illnesses.  Immunodeficiency screen: Eight or more ear infections in one year: NO Two or more serious sinus infections in one year: YES Two or more bouts of pneumonia in one year: NO Two or more deep-seated infections, or infections in unusual  areas: NO Recurrent deep skin or organ abscesses: NO Need for intravenous antibiotic therapy to clear infection: NO Infections with unusual or opportunistic organisms: NO Family history of primary immunodeficiency: NO Recurrent fevers: NO  Physical signs screen: Poor growth, failure to thrive: NO Absent lymph nodes or tonsils: NO Skin lesions: telangiectasias, petechiae, dermatomyositis, lupus-like rash: NO Ataxia (with ataxia-telangiectasia): NO Oral thrush after one year of age: NO Oral ulcers: NO  GERD Symptom History: He is on Protonix daily for his GERD.  He also has famotidine 20 mg daily.  This was started by Dr. Sherene Sires.  It did help a lot with his stomach gurgling and upset, but did not help with his chest pain.  Otherwise, there is no history of other atopic diseases, including drug allergies, stinging insect allergies, or contact dermatitis. There is no significant infectious history. Vaccinations are up to date.    Past Medical History: Patient Active Problem List   Diagnosis Date Noted   DOE (dyspnea on exertion) 04/12/2016   Atypical chest pain 04/12/2016    Medication List:  Allergies as of 07/10/2023   No Known Allergies      Medication List        Accurate as of July 10, 2023  9:33 AM. If you have any questions, ask your nurse or doctor.          albuterol 108 (90 Base) MCG/ACT inhaler Commonly known as: VENTOLIN HFA Inhale 1-2 puffs into the lungs every 6 (six) hours as needed for wheezing or shortness of breath.   amLODipine 2.5 MG tablet Commonly known as: NORVASC Take 1 tablet (2.5 mg total) by mouth daily.   aspirin EC 81 MG tablet Take 1 tablet (81 mg total) by mouth daily. Swallow whole.   atorvastatin 40 MG tablet Commonly known as: LIPITOR Take 40 mg by mouth daily.   docusate sodium 100 MG capsule Commonly known as: COLACE Take 100 mg by mouth 2 (two) times daily as needed for mild constipation.   ezetimibe 10 MG tablet Commonly  known as: ZETIA TAKE 1 TABLET(10 MG) BY MOUTH DAILY   famotidine 20 MG tablet Commonly known as: Pepcid One after supper   fluticasone 50 MCG/ACT nasal spray Commonly known as: FLONASE SHAKE LQ AND U 2 SPRAYS IEN QD   HYDROcodone-acetaminophen 10-325 MG tablet Commonly known as: NORCO Take 1 tablet by mouth every 4 (four) hours as needed. pain   isosorbide mononitrate 30 MG 24 hr tablet Commonly known as: IMDUR TAKE 1 TABLET(30 MG) BY MOUTH DAILY   meloxicam 15 MG tablet Commonly known as: MOBIC Take 15 mg by mouth as needed.   oxyCODONE 20 MG 12 hr tablet Commonly known as: OXYCONTIN Take 20 mg by mouth every 12 (twelve) hours.   pantoprazole 40 MG tablet Commonly known as: PROTONIX TAKE 1 TABLET(40 MG) BY MOUTH DAILY 30 TO 60 MINUTES BEFORE FIRST MEAL OF THE DAY   promethazine 25 MG tablet Commonly known as: PHENERGAN Take 25-50 mg by mouth every 8 (  eight) hours as needed. nausea   valsartan 160 MG tablet Commonly known as: DIOVAN Take 1 tablet (160 mg total) by mouth daily.        Birth History: non-contributory  Developmental History: non-contributory  Past Surgical History: Past Surgical History:  Procedure Laterality Date   BACK SURGERY     laminectomy-lumbar x3   COLONOSCOPY WITH PROPOFOL N/A 11/28/2015   Procedure: COLONOSCOPY WITH PROPOFOL;  Surgeon: Charolett Bumpers, MD;  Location: [facility];  Service: Endoscopy;  Laterality: N/A;   INGUINAL HERNIA REPAIR Right 05/18/2013   Procedure: RIGHT INGUINAL HERNIA REPAIR ADULT;  Surgeon: Marlane Hatcher, MD;  Location: [facility];  Service: General;  Laterality: Right;  (no mesh)   SKIN CANCER EXCISION     nose   VASECTOMY       Family History: Family History  Problem Relation Age of Onset   CAD Mother      Social History: Theo lives at home with his family.  They live in a house that is 70 years old.  There is no mildew damage.  There is hardwood throughout the home.  They have gas heating and  central cooling.  There are dogs inside and outside of the home.  There are no roaches.  They do have dust mite covers on the bedding.  There is no current tobacco exposure.  He did smoke around 40 or 50 years ago.  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  There is no fume, chemical, or dust exposure.  They do have a HEPA filter.  They do not live near an interstate or industrial area.  He does not have any particular hobbies so that he is retired, but he does take care of one of his grandchildren every other week.   Review of systems otherwise negative other than that mentioned in the HPI.    Objective:   Blood pressure 130/70, pulse 70, temperature 98.4 F (36.9 C), height 5\' 10"  (1.778 m), weight 191 lb (86.6 kg), SpO2 96%. Body mass index is 27.41 kg/m.     Physical Exam Vitals reviewed.  Constitutional:      Appearance: He is well-developed.     Comments: Pleasant.  Cooperative with the exam.   HENT:     Head: Normocephalic and atraumatic.     Right Ear: Tympanic membrane, ear canal and external ear normal. No drainage, swelling or tenderness. Tympanic membrane is not injected, scarred, erythematous, retracted or bulging.     Left Ear: Tympanic membrane, ear canal and external ear normal. No drainage, swelling or tenderness. Tympanic membrane is not injected, scarred, erythematous, retracted or bulging.     Nose: No nasal deformity, septal deviation, mucosal edema or rhinorrhea.     Right Turbinates: Enlarged, swollen and pale.     Left Turbinates: Enlarged, swollen and pale.     Right Sinus: No maxillary sinus tenderness or frontal sinus tenderness.     Left Sinus: No maxillary sinus tenderness or frontal sinus tenderness.     Comments: No polyps.  Clear rhinorrhea.    Mouth/Throat:     Lips: Pink.     Mouth: Mucous membranes are moist. Mucous membranes are not pale and not dry.     Pharynx: Oropharynx is clear. Uvula midline.     Tonsils:  No tonsillar exudate or tonsillar abscesses. 1+ on the right. 1+ on the left.     Comments: Minimal cobblestoning. Eyes:     General:  Right eye: No discharge.        Left eye: No discharge.     Conjunctiva/sclera: Conjunctivae normal.     Right eye: Right conjunctiva is not injected. No chemosis.    Left eye: Left conjunctiva is not injected. No chemosis.    Pupils: Pupils are equal, round, and reactive to light.  Cardiovascular:     Rate and Rhythm: Normal rate and regular rhythm.     Heart sounds: Normal heart sounds.  Pulmonary:     Effort: Pulmonary effort is normal. No tachypnea, accessory muscle usage or respiratory distress.     Breath sounds: Normal breath sounds. No wheezing, rhonchi or rales.  Chest:     Chest wall: No tenderness.  Abdominal:     Tenderness: There is no abdominal tenderness. There is no guarding or rebound.  Lymphadenopathy:     Head:     Right side of head: No submandibular, tonsillar or occipital adenopathy.     Left side of head: No submandibular, tonsillar or occipital adenopathy.     Cervical: No cervical adenopathy.  Skin:    Coloration: Skin is not pale.     Findings: No abrasion, erythema, petechiae or rash. Rash is not papular, urticarial or vesicular.  Neurological:     Mental Status: He is alert.  Psychiatric:        Behavior: Behavior is cooperative.      Diagnostic studies:    Spirometry: results normal (FEV1: 2.71/84%, FVC: 3.65/86%, FEV1/FVC: 70%).    Spirometry consistent with normal pattern.   Allergy Studies: none           Malachi Bonds, MD [facility]

## 2023-07-11 ENCOUNTER — Encounter: Payer: Self-pay | Admitting: Internal Medicine

## 2023-07-11 ENCOUNTER — Ambulatory Visit: Payer: Medicare Other | Attending: Internal Medicine | Admitting: Internal Medicine

## 2023-07-11 VITALS — BP 142/80 | HR 70 | Wt 201.4 lb

## 2023-07-11 DIAGNOSIS — I2510 Atherosclerotic heart disease of native coronary artery without angina pectoris: Secondary | ICD-10-CM

## 2023-07-11 NOTE — Patient Instructions (Signed)
Medication Instructions:   *If you need a refill on your cardiac medications before your next appointment, please call your pharmacy*   Lab Work:  If you have labs (blood work) drawn today and your tests are completely normal, you will receive your results only by: MyChart Message (if you have MyChart) OR A paper copy in the mail If you have any lab test that is abnormal or we need to change your treatment, we will call you to review the results.   Testing/Procedures:    Follow-Up: At [facility], you and your health needs are our priority.  As part of our continuing mission to provide you with exceptional heart care, we have created designated Provider Care Teams.  These Care Teams include your primary Cardiologist (physician) and Advanced Practice Providers (APPs -  Physician Assistants and Nurse Practitioners) who all work together to provide you with the care you need, when you need it.  We recommend signing up for the patient portal called "MyChart".  Sign up information is provided on this After Visit Summary.  MyChart is used to connect with patients for Virtual Visits (Telemedicine).  Patients are able to view lab/test results, encounter notes, upcoming appointments, etc.  Non-urgent messages can be sent to your provider as well.   To learn more about what you can do with MyChart, go to [email or web address].    Your next appointment:  3-4 weeks with nurse for BP check with log and cuff from home

## 2023-07-12 ENCOUNTER — Ambulatory Visit: Payer: Medicare Other | Admitting: Allergy & Immunology

## 2023-07-12 ENCOUNTER — Encounter: Payer: Self-pay | Admitting: Allergy & Immunology

## 2023-07-12 VITALS — BP 130/74 | HR 71 | Temp 98.0°F | Resp 16

## 2023-07-12 DIAGNOSIS — K219 Gastro-esophageal reflux disease without esophagitis: Secondary | ICD-10-CM

## 2023-07-12 DIAGNOSIS — J3089 Other allergic rhinitis: Secondary | ICD-10-CM

## 2023-07-12 DIAGNOSIS — R0602 Shortness of breath: Secondary | ICD-10-CM

## 2023-07-12 MED ORDER — LEVOCETIRIZINE DIHYDROCHLORIDE 5 MG PO TABS
5.0000 mg | ORAL_TABLET | Freq: Every evening | ORAL | 1 refills | Status: DC
Start: 2023-07-12 — End: 2024-01-15

## 2023-07-12 MED ORDER — AZELASTINE HCL 0.1 % NA SOLN: 2.0000 | Freq: Two times a day (BID) | NASAL | 5 refills | Status: AC | PRN

## 2023-07-12 NOTE — Patient Instructions (Addendum)
1. Mild intermittent asthma, uncomplicated - Lung testing looks great at the first visit.  - Given your lack of improvement with the Symbicort, I am not sure that this is asthma. - I would continue with the use of albuterol as needed like you are doing.  - I am not sure how to explain the shortness of breath.   2. Perennial allergic rhinitis - Testing today showed: indoor molds, dust mites, and cat - Copy of test results provided.  - Avoidance measures provided. - Continue with: Xyzal (levocetirizine) 5mg  tablet once daily and Flonase (fluticasone) two sprays per nostril daily (AIM FOR EAR ON EACH SIDE) - Start taking: Astelin (azelastine) 2 sprays per nostril 1-2 times daily as needed on particularly bad days.  - You can use an extra dose of the antihistamine, if needed, for breakthrough symptoms.  - Consider nasal saline rinses 1-2 times daily to remove allergens from the nasal cavities as well as help with mucous clearance (this is especially helpful to do before the nasal sprays are given) - Consider allergy shots as a means of long-term control. - Allergy shots "re-train" and "reset" the immune system to ignore environmental allergens and decrease the resulting immune response to those allergens (sneezing, itchy watery eyes, runny nose, nasal congestion, etc).    - Allergy shots improve symptoms in 75-85% of patients.  - We can discuss more at the next appointment if the medications are not working for you.  3. Return in about 6 months (around 01/10/2024).    Please inform us of any Emergency Department visits, hospitalizations, or changes in symptoms. Call us before going to the ED for breathing or allergy symptoms since we might be able to fit you in for a sick visit. Feel free to contact us anytime with any questions, problems, or concerns.  It was a pleasure to see you again today!  Websites that have reliable patient information: 1. American Academy of Asthma, Allergy, and  Immunology: www.aaaai.org 2. Food Allergy Research and Education (FARE): foodallergy.org 3. Mothers of Asthmatics: http://www.asthmacommunitynetwork.org 4. American College of Allergy, Asthma, and Immunology: www.acaai.org   COVID-19 Vaccine Information can be found at: [email or web address].[email or web address] For questions related to vaccine distribution or appointments, please email [email or web address] or call [phone].     "Like" [place] on Facebook and Instagram for our latest updates!      A healthy democracy works best when [employer] participate! Make sure you are registered to vote! If you have moved or changed any of your contact information, you will need to get this updated before voting! Scan the QR codes below to learn more!          Airborne Adult Perc - 07/12/23 0849     Time Antigen Placed 0849    Allergen Manufacturer Waynette Buttery    Location Back    Number of Test 55    1. Control-Buffer 50% Glycerol Negative    2. Control-Histamine 2+    3. Bahia Negative    4. French Southern Territories Negative    5. Johnson Negative    6. Kentucky Blue Negative    7. Meadow Fescue Negative    8. Perennial Rye Negative    9. Timothy Negative    10. Ragweed Mix Negative    11. Cocklebur Negative    12. Plantain,  English Negative    13. Baccharis Negative    14. Dog Fennel Negative    15. Russian Thistle Negative    16. Lamb's Quarters Negative    17.  Sheep Sorrell Negative    18. Rough Pigweed Negative    19. Marsh Elder, Rough Negative    20. Mugwort, Common Negative    21. Box, Elder Negative    22. Cedar, red Negative    23. Sweet Gum Negative    24. Pecan Pollen Negative    25. Pine Mix Negative    26. Walnut, Black Pollen Negative    27. Red Mulberry Negative    28. Ash Mix Negative    29. Birch Mix Negative    30. Beech American Negative    31. Cottonwood, Guinea-Bissau Negative    32. Hickory, White Negative    33. Maple Mix Negative     34. Oak, Guinea-[place]issau Mix Negative    35. Sycamore Eastern Negative    36. Alternaria Alternata Negative    37. Cladosporium Herbarum Negative    38. Aspergillus Mix Negative    39. Penicillium Mix Negative    40. Bipolaris Sorokiniana (Helminthosporium) Negative    41. Drechslera Spicifera (Curvularia) Negative    42. Mucor Plumbeus Negative    43. Fusarium Moniliforme Negative    44. Aureobasidium Pullulans (pullulara) Negative    45. Rhizopus Oryzae Negative    46. Botrytis Cinera Negative    47. Epicoccum Nigrum Negative    48. Phoma Betae Negative    49. Dust Mite Mix Negative    50. Cat Hair 10,000 BAU/ml Negative    51.  Dog Epithelia Negative    52. Mixed Feathers Negative    53. Horse Epithelia Negative    54. Cockroach, German Negative    55. Tobacco Leaf Negative             Intradermal - 07/12/23 0859     Time Antigen Placed 0900    Allergen Manufacturer Waynette Buttery    Location Arm    Number of Test 16    Control Negative    Bahia Negative    French Southern Territories Negative    Johnson Negative    7 Grass Negative    Ragweed Mix Negative    Weed Mix Negative    Tree Mix Negative    Mold 1 Negative    Mold 2 3+    Mold 3 Negative    Mold 4 4+    Mite Mix 3+    Cat 2+    Dog Negative    Cockroach Negative             Control of Dust Mite Allergen    Dust mites play a major role in allergic asthma and rhinitis.  They occur in environments with high humidity wherever human skin is found.  Dust mites absorb humidity from the atmosphere (ie, they do not drink) and feed on organic matter (including shed human and animal skin).  Dust mites are a microscopic type of insect that you cannot see with the naked eye.  High levels of dust mites have been detected from mattresses, pillows, carpets, upholstered furniture, bed covers, clothes, soft toys and any woven material.  The principal allergen of the dust mite is found in its feces.  A gram of dust may contain 1,000 mites and  250,000 fecal particles.  Mite antigen is easily measured in the air during house cleaning activities.  Dust mites do not bite and do not cause harm to humans, other than by triggering allergies/asthma.    Ways to decrease your exposure to dust mites in your home:  Encase mattresses, box springs and pillows with  a mite-impermeable barrier or cover   Wash sheets, blankets and drapes weekly in hot water (130 F) with detergent and dry them in a dryer on the hot setting.  Have the room cleaned frequently with a vacuum cleaner and a damp dust-mop.  For carpeting or rugs, vacuuming with a vacuum cleaner equipped with a high-efficiency particulate air (HEPA) filter.  The dust mite allergic individual should not be in a room which is being cleaned and should wait 1 hour after cleaning before going into the room. Do not sleep on upholstered furniture (eg, couches).   If possible removing carpeting, upholstered furniture and drapery from the home is ideal.  Horizontal blinds should be eliminated in the rooms where the person spends the most time (bedroom, study, television room).  Washable vinyl, roller-type shades are optimal. Remove all non-washable stuffed toys from the bedroom.  Wash stuffed toys weekly like sheets and blankets above.   Reduce indoor humidity to less than 50%.  Inexpensive humidity monitors can be purchased at most hardware stores.  Do not use a humidifier as can make the problem worse and are not recommended.  Control of Mold Allergen   Mold and fungi can grow on a variety of surfaces provided certain temperature and moisture conditions exist.  Outdoor molds grow on plants, decaying vegetation and soil.  The major outdoor mold, Alternaria and Cladosporium, are found in very high numbers during hot and dry conditions.  Generally, a late Summer - Fall peak is seen for common outdoor fungal spores.  Rain will temporarily lower outdoor mold spore count, but counts rise rapidly when the rainy  period ends.  The most important indoor molds are Aspergillus and Penicillium.  Dark, humid and poorly ventilated basements are ideal sites for mold growth.  The next most common sites of mold growth are the bathroom and the kitchen.   Indoor (Perennial) Mold Control   Positive indoor molds via skin testing: Aspergillus, Penicillium, Fusarium, Aureobasidium (Pullulara), and Rhizopus  Maintain humidity below 50%. Clean washable surfaces with 5% bleach solution. Remove sources e.g. contaminated carpets.    Control of Dog or Cat Allergen  Avoidance is the best way to manage a dog or cat allergy. If you have a dog or cat and are allergic to dog or cats, consider removing the dog or cat from the home. If you have a dog or cat but don't want to find it a new home, or if your family wants a pet even though someone in the household is allergic, here are some strategies that may help keep symptoms at bay:  Keep the pet out of your bedroom and restrict it to only a few rooms. Be advised that keeping the dog or cat in only one room will not limit the allergens to that room. Don't pet, hug or kiss the dog or cat; if you do, wash your hands with soap and water. High-efficiency particulate air (HEPA) cleaners run continuously in a bedroom or living room can reduce allergen levels over time. Regular use of a high-efficiency vacuum cleaner or a central vacuum can reduce allergen levels. Giving your dog or cat a bath at least once a week can reduce airborne allergen.

## 2023-07-12 NOTE — Progress Notes (Signed)
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 Mild intermittent asthma, uncomplicated - Lung testing looks great at the first visit.  - Given your lack of improvement with the Symbicort, I am not sure that this is asthma. - I would continue with the use of albuterol as needed like you are doing.  - I am not sure how to explain the shortness of breath.   2. Perennial allergic rhinitis - Testing today showed: indoor molds, dust mites, and cat - Copy of test results provided.  - Avoidance measures provided. - Continue with: Xyzal (levocetirizine) 5mg  tablet once daily and Flonase (fluticasone) two sprays per nostril daily (AIM FOR EAR ON EACH SIDE) - Start taking: Astelin (azelastine) 2 sprays per nostril 1-2 times daily as needed on particularly bad days.  - You can use an extra dose of the antihistamine, if needed, for breakthrough symptoms.  - Consider nasal saline rinses 1-2 times daily to remove allergens from the nasal cavities as well as help with mucous clearance (this is especially helpful to do before the nasal sprays are given) - Consider allergy shots as a means of long-term control. - Allergy shots "re-train" and "reset" the immune system to ignore environmental allergens and decrease the resulting immune response to those allergens (sneezing, itchy watery eyes, runny nose, nasal congestion, etc).    - Allergy shots improve symptoms in 75-85% of patients.  - We can discuss more at the next appointment if the medications are not working for you.  3. Return in about 6 months (around 01/10/2024).    Subjective:   Jacob [name] is a  72 y.o. male presenting today for follow up of  Chief Complaint  Patient presents with   Allergy Testing    Jacob [name] has a history of the following: Patient Active Problem List   Diagnosis Date Noted   DOE (dyspnea on exertion) 04/12/2016   Atypical chest pain 04/12/2016    History obtained from: chart review and patient.  Discussed the use of AI scribe software for clinical note transcription with the patient and/or guardian, who gave verbal consent to proceed.  Jacob [name] is a 72 y.o. male presenting for skin testing. He was last seen on October 9. We could not do testing because his insurance company does not cover testing on the same day as a New Patient visit. He has been off of all antihistamines 3 days in anticipation of the testing.   Otherwise, there have been no changes to his past medical history, surgical history, family history, or social history.    Review of systems otherwise negative other than that mentioned in the HPI.    Objective:   Blood pressure (!) 150/66, pulse 71, temperature 98 F (36.7 C), resp. rate 16, SpO2 95%. There is no height or weight on file to calculate BMI.    Physical exam deferred since this was a skin testing appointment only.   Diagnostic studies:   Allergy Studies:     Airborne Adult Perc - 07/12/23 0849     Time Antigen Placed 6295    Allergen Manufacturer Waynette Buttery    Location Back  Number of Test 55    1. Control-Buffer 50% Glycerol Negative    2. Control-Histamine 2+    3. Bahia Negative    4. French Southern Territories Negative    5. Johnson Negative    6. [place] Blue Negative    7. Meadow Fescue Negative    8. Perennial Rye Negative    9. Timothy Negative    10. Ragweed Mix Negative    11. Cocklebur Negative    12. Plantain,  English Negative    13. Baccharis Negative    14. Dog Fennel Negative    15. Russian Thistle Negative    16. Lamb's Quarters Negative    17. Sheep Sorrell Negative    18. Rough Pigweed Negative    19.  Marsh Elder, Rough Negative    20. Mugwort, Common Negative    21. Box, Elder Negative    22. Cedar, red Negative    23. Sweet Gum Negative    24. Pecan Pollen Negative    25. Pine Mix Negative    26. Walnut, Black Pollen Negative    27. Red Mulberry Negative    28. Ash Mix Negative    29. Birch Mix Negative    30. Beech American Negative    31. Cottonwood, Guinea-Bissau Negative    32. Hickory, White Negative    33. Maple Mix Negative    34. Oak, Guinea-[place] Mix Negative    35. Sycamore Eastern Negative    36. Alternaria Alternata Negative    37. Cladosporium Herbarum Negative    38. Aspergillus Mix Negative    39. Penicillium Mix Negative    40. Bipolaris Sorokiniana (Helminthosporium) Negative    41. Drechslera Spicifera (Curvularia) Negative    42. Mucor Plumbeus Negative    43. Fusarium Moniliforme Negative    44. Aureobasidium Pullulans (pullulara) Negative    45. Rhizopus Oryzae Negative    46. Botrytis Cinera Negative    47. Epicoccum Nigrum Negative    48. Phoma Betae Negative    49. Dust Mite Mix Negative    50. Cat Hair 10,000 BAU/ml Negative    51.  Dog Epithelia Negative    52. Mixed Feathers Negative    53. Horse Epithelia Negative    54. Cockroach, German Negative    55. Tobacco Leaf Negative             Intradermal - 07/12/23 0859     Time Antigen Placed 0900    Allergen Manufacturer Waynette Buttery    Location Arm    Number of Test 16    Control Negative    Bahia Negative    French Southern Territories Negative    Johnson Negative    7 Grass Negative    Ragweed Mix Negative    Weed Mix Negative    Tree Mix Negative    Mold 1 Negative    Mold 2 3+    Mold 3 Negative    Mold 4 4+    Mite Mix 3+    Cat 2+    Dog Negative    Cockroach Negative             Allergy testing results were read and interpreted by myself, documented by clinical staff.      Malachi Bonds, MD  [facility]

## 2023-07-16 NOTE — Addendum Note (Signed)
Addended by: Philipp Deputy on: 07/16/2023 11:10 AM   Modules accepted: Orders

## 2023-07-19 ENCOUNTER — Other Ambulatory Visit: Payer: Self-pay | Admitting: Internal Medicine

## 2023-07-24 DIAGNOSIS — G894 Chronic pain syndrome: Secondary | ICD-10-CM | POA: Diagnosis not present

## 2023-07-24 DIAGNOSIS — M5106 Intervertebral disc disorders with myelopathy, lumbar region: Secondary | ICD-10-CM | POA: Diagnosis not present

## 2023-07-27 ENCOUNTER — Other Ambulatory Visit: Payer: Self-pay | Admitting: Nurse Practitioner

## 2023-08-06 ENCOUNTER — Ambulatory Visit: Payer: Medicare Other | Attending: Cardiovascular Disease | Admitting: *Deleted

## 2023-08-06 DIAGNOSIS — I10 Essential (primary) hypertension: Secondary | ICD-10-CM

## 2023-08-06 NOTE — Progress Notes (Signed)
     Nurse Visit   Date of Encounter: 08/06/2023 ID: SEMISI BIELA, DOB [birth year], MRN [id number]  PCP:  Georgann Housekeeper, MD   [facility] Providers Cardiologist:  Dietrich Pates, MD      Visit Details   VS:  BP (!) 142/76 (BP Location: Left Arm)   Pulse 74   Ht 5\' 10"  (1.778 m)   Wt 199 lb (90.3 kg)   BMI 28.55 kg/m  , BMI Body mass index is 28.55 kg/m.  Wt Readings from Last 3 Encounters:  08/06/23 199 lb (90.3 kg)  07/11/23 201 lb 6.4 oz (91.4 kg)  07/10/23 191 lb (86.6 kg)     Reason for visit:BP check Performed today: Vitals, EKG, Provider consulted:Dr Eden Emms, and Education Changes (medications, testing, etc.) : no changes Length of Visit: 30 minutes    Medications Adjustments/Labs and Tests Ordered: No orders of the defined types were placed in this encounter.  No orders of the defined types were placed in this encounter.    Signed, Genice Rouge, RN  08/06/2023 11:38 AM   Patient here for BP check. Brought home cuff and readings from home. BP checked with patient's home cuff- 146/69, pulse 76 in right arm 142/76, pulse 74 in left arm  Office cuff- 142/72 in right arm 138/72 in left arm. Pulse 74  Proper way to take BP reviewed with patient.   Reviewed with Dr Eden Emms. No changes made.  Will place patient's readings from home in Dr Charlott Rakes box for review. Patient will continue home medications. I told him we would call him if Dr Tenny Craw wanted to make any changes.

## 2023-08-09 ENCOUNTER — Telehealth: Payer: Self-pay

## 2023-08-09 NOTE — Telephone Encounter (Signed)
Received BP readings from the pt:   Starting 07/17/23:   145/79 HR 68 140/72  HR 70 132/71  HR 63 138/68  HR 56 130/65 HR 65 131/64 HR 61 121/58  HR 63 136/61  61 122/64  63 128/64  76 135/56  80 133/60  74 136/66  72 139/67  69 129/67  67 128/68  70 131/69  70 135/65  67 129/70  71

## 2023-08-13 NOTE — Telephone Encounter (Signed)
I would add hydrochlorothiazide 12.5 mg   for tighter BP control If this helps it comes as a combo pill with valsartan

## 2023-08-14 MED ORDER — VALSARTAN-HYDROCHLOROTHIAZIDE 160-12.5 MG PO TABS: 1.0000 | ORAL_TABLET | Freq: Every day | ORAL | 3 refills | Status: DC

## 2023-08-14 NOTE — Telephone Encounter (Signed)
Pt advised and will add the HCTZ as a combo with his Valsartan. He will let us know how his BP is doing.

## 2023-08-19 ENCOUNTER — Telehealth: Payer: Self-pay

## 2023-08-19 NOTE — Telephone Encounter (Signed)
-----   Message from [place] sent at 08/18/2023  9:16 PM EST ----- Regarding: RE: BP check I would recomm increasing amlodipine to 5 mg daily   Keep following BP at home ----- Message ----- From: Dossie Arbour, RN Sent: 08/06/2023  11:46 AM EST To: Pricilla Riffle, MD; [name], RN Subject: BP check                                       Patient had BP check today.   I put the readings he brought from home in your box and told him we would call him if you wanted to make any changes. Thanks [employer]

## 2023-08-19 NOTE — Telephone Encounter (Signed)
Jacob Riffle, MD  [name], RN I would recomm increasing amlodipine to 5 mg daily   Keep following BP at home

## 2023-08-20 NOTE — Telephone Encounter (Signed)
Pt advised and says he has tried Amlodipine 5 mg in the past and he had significant lower extremity edema.. will send to Dr Tenny Craw for further med management.

## 2023-08-20 NOTE — Telephone Encounter (Signed)
Left a message for the pt to call back.  

## 2023-08-21 DIAGNOSIS — M5106 Intervertebral disc disorders with myelopathy, lumbar region: Secondary | ICD-10-CM | POA: Diagnosis not present

## 2023-08-21 DIAGNOSIS — G894 Chronic pain syndrome: Secondary | ICD-10-CM | POA: Diagnosis not present

## 2023-08-27 NOTE — Telephone Encounter (Signed)
Left a message for the pt to call back.  

## 2023-08-27 NOTE — Telephone Encounter (Signed)
Lets try maxzide 37.5/25   Take 1/2 to start Follow up BMET in 10 days Follow BP

## 2023-09-02 NOTE — Telephone Encounter (Signed)
Patient is returning phone call.  °

## 2023-09-03 DIAGNOSIS — H35371 Puckering of macula, right eye: Secondary | ICD-10-CM | POA: Diagnosis not present

## 2023-09-03 DIAGNOSIS — H524 Presbyopia: Secondary | ICD-10-CM | POA: Diagnosis not present

## 2023-09-03 NOTE — Telephone Encounter (Signed)
Left a message for the pt to call back.  

## 2023-09-05 NOTE — Telephone Encounter (Signed)
Pt is taking: valsartan-hydrochlorothiazide (DIOVAN HCT) 160-12.5 MG tablet   Just double checking to still add Maxzide to this... will send to Dr Tenny Craw.

## 2023-09-10 NOTE — Telephone Encounter (Signed)
Pt says his BP has been staying in the 120's and the diastolic has been in the 50's consistently.   Pt says he has been very tired  but no dizziness.   Will send back to Dr Tenny Craw again to see if the med is appropriate.   He will keep a log and let us know what they are in a few days.

## 2023-09-10 NOTE — Telephone Encounter (Signed)
Recom Maxzide 37.5/25 Check BMET in 10 days

## 2023-09-16 ENCOUNTER — Telehealth: Payer: Self-pay | Admitting: Internal Medicine

## 2023-09-16 NOTE — Telephone Encounter (Signed)
Pt was told to report bp back in a few days   12/10: 7pm 126/60 hr69  12/11: 10am 130/58 hr84 12/11: 6pm 113/64 hr65   12/12: 10am 137/64 hr84 12/12: 7pm 128/61 hr66  12/13: 1130am 134/59 hr 85 12/13: 6pm 117/65 hr76  12/14: 10am 129/53 hr68  12/14: 7pm 123/56 hr71   12/15: 9am: 130/66 hr70 12/15: 7pm 136/61 hr61   12/16: 10am 121/62 hr 72

## 2023-09-16 NOTE — Telephone Encounter (Signed)
Left a message for the pt to call back.  

## 2023-09-16 NOTE — Telephone Encounter (Signed)
Pt advised and will have his BP rechecked at his next OV with his PCP.

## 2023-09-16 NOTE — Telephone Encounter (Signed)
BP is much better at home  He needs to bring cuff in to next appt (anywhere) to confirm that cuff is accurate

## 2023-09-19 DIAGNOSIS — G894 Chronic pain syndrome: Secondary | ICD-10-CM | POA: Diagnosis not present

## 2023-09-19 DIAGNOSIS — M5106 Intervertebral disc disorders with myelopathy, lumbar region: Secondary | ICD-10-CM | POA: Diagnosis not present

## 2023-10-17 ENCOUNTER — Other Ambulatory Visit: Payer: Self-pay | Admitting: Internal Medicine

## 2023-10-17 DIAGNOSIS — G894 Chronic pain syndrome: Secondary | ICD-10-CM | POA: Diagnosis not present

## 2023-10-17 DIAGNOSIS — M545 Low back pain, unspecified: Secondary | ICD-10-CM | POA: Diagnosis not present

## 2023-10-21 ENCOUNTER — Other Ambulatory Visit: Payer: Self-pay | Admitting: Internal Medicine

## 2023-11-13 DIAGNOSIS — I77810 Thoracic aortic ectasia: Secondary | ICD-10-CM | POA: Diagnosis not present

## 2023-11-13 DIAGNOSIS — R7303 Prediabetes: Secondary | ICD-10-CM | POA: Diagnosis not present

## 2023-11-13 DIAGNOSIS — J309 Allergic rhinitis, unspecified: Secondary | ICD-10-CM | POA: Diagnosis not present

## 2023-11-13 DIAGNOSIS — J45909 Unspecified asthma, uncomplicated: Secondary | ICD-10-CM | POA: Diagnosis not present

## 2023-11-13 DIAGNOSIS — I10 Essential (primary) hypertension: Secondary | ICD-10-CM | POA: Diagnosis not present

## 2023-11-13 DIAGNOSIS — I25119 Atherosclerotic heart disease of native coronary artery with unspecified angina pectoris: Secondary | ICD-10-CM | POA: Diagnosis not present

## 2023-11-14 DIAGNOSIS — M545 Low back pain, unspecified: Secondary | ICD-10-CM | POA: Diagnosis not present

## 2023-11-14 DIAGNOSIS — G894 Chronic pain syndrome: Secondary | ICD-10-CM | POA: Diagnosis not present

## 2023-12-04 DIAGNOSIS — D225 Melanocytic nevi of trunk: Secondary | ICD-10-CM | POA: Diagnosis not present

## 2023-12-04 DIAGNOSIS — L821 Other seborrheic keratosis: Secondary | ICD-10-CM | POA: Diagnosis not present

## 2023-12-04 DIAGNOSIS — L814 Other melanin hyperpigmentation: Secondary | ICD-10-CM | POA: Diagnosis not present

## 2023-12-04 DIAGNOSIS — B079 Viral wart, unspecified: Secondary | ICD-10-CM | POA: Diagnosis not present

## 2023-12-04 DIAGNOSIS — L570 Actinic keratosis: Secondary | ICD-10-CM | POA: Diagnosis not present

## 2023-12-04 DIAGNOSIS — D492 Neoplasm of unspecified behavior of bone, soft tissue, and skin: Secondary | ICD-10-CM | POA: Diagnosis not present

## 2023-12-04 DIAGNOSIS — L538 Other specified erythematous conditions: Secondary | ICD-10-CM | POA: Diagnosis not present

## 2023-12-12 DIAGNOSIS — M545 Low back pain, unspecified: Secondary | ICD-10-CM | POA: Diagnosis not present

## 2023-12-12 DIAGNOSIS — G894 Chronic pain syndrome: Secondary | ICD-10-CM | POA: Diagnosis not present

## 2024-01-09 DIAGNOSIS — Z79891 Long term (current) use of opiate analgesic: Secondary | ICD-10-CM | POA: Diagnosis not present

## 2024-01-09 DIAGNOSIS — G894 Chronic pain syndrome: Secondary | ICD-10-CM | POA: Diagnosis not present

## 2024-01-09 DIAGNOSIS — M545 Low back pain, unspecified: Secondary | ICD-10-CM | POA: Diagnosis not present

## 2024-01-09 DIAGNOSIS — Z79899 Other long term (current) drug therapy: Secondary | ICD-10-CM | POA: Diagnosis not present

## 2024-01-15 ENCOUNTER — Other Ambulatory Visit: Payer: Self-pay | Admitting: Allergy & Immunology

## 2024-01-15 ENCOUNTER — Other Ambulatory Visit: Payer: Self-pay | Admitting: Internal Medicine

## 2024-01-19 NOTE — Progress Notes (Signed)
 Cardiology Office Note   Date:  01/20/2024   ID:  Sanjay, Broadfoot 1951/09/01, MRN [id number]  PCP:  Jearldine Mina, MD  Cardiologist:   Ola Berger, MD   Follow up of CAD and HTN and SOB      History of Present Illness: Jacob [name] is a 72 y.o. male with a history ofhistory of HL, CP and CAD    2019  Myoview  was normal    2022 CCTA showed Ca score of approx 1800   Mild to mod CAD on CT   Aug  2023  Myoview  normal            Diet ;    [employer]/sweet tea and chicken biscuit   Unsweet coffee   Normally egg and toast Lunch:  PB and J sandwich   Water  Dinner:  Not a lot of dinner   Puff corn    I saw the pt in OCt 2024  Since seen he denies CP  But he says he gets exhausted easily  Example if working in yard will have to stop     He has not changed his activity though   He notes occasional dizziness    BP at home 110/40s to     Current Meds  Medication Sig   albuterol  (PROVENTIL  HFA;VENTOLIN  HFA) 108 (90 Base) MCG/ACT inhaler Inhale 1-2 puffs into the lungs every 6 (six) hours as needed for wheezing or shortness of breath.   aspirin  EC 81 MG tablet Take 1 tablet (81 mg total) by mouth daily. Swallow whole.   atorvastatin  (LIPITOR) 40 MG tablet Take 40 mg by mouth daily.   azelastine  (ASTELIN ) 0.1 % nasal spray Place 2 sprays into both nostrils 2 (two) times daily as needed for rhinitis. Use in each nostril as directed   docusate sodium  (COLACE) 100 MG capsule Take 100 mg by mouth 2 (two) times daily as needed for mild constipation.   ezetimibe  (ZETIA ) 10 MG tablet TAKE 1 TABLET(10 MG) BY MOUTH DAILY   famotidine  (PEPCID ) 20 MG tablet TAKE 1 TABLET BY MOUTH AFTER SUPPER   fluticasone (FLONASE) 50 MCG/ACT nasal spray SHAKE LQ AND U 2 SPRAYS IEN QD   HYDROcodone-acetaminophen (NORCO) 10-325 MG tablet Take 1 tablet by mouth every 4 (four) hours as needed. pain   isosorbide  mononitrate (IMDUR ) 30 MG 24 hr tablet TAKE 1 TABLET(30 MG) BY MOUTH DAILY   levocetirizine  (XYZAL ) 5 MG tablet TAKE 1 TABLET(5 MG) BY MOUTH EVERY EVENING   meloxicam (MOBIC) 15 MG tablet Take 15 mg by mouth as needed.    oxyCODONE  (OXYCONTIN ) 20 MG 12 hr tablet Take 20 mg by mouth every 12 (twelve) hours.   pantoprazole  (PROTONIX ) 40 MG tablet TAKE 1 TABLET(40 MG) BY MOUTH DAILY 30 TO 60 MINUTES BEFORE FIRST MEAL OF THE DAY   promethazine (PHENERGAN) 25 MG tablet Take 25-50 mg by mouth every 8 (eight) hours as needed. nausea   valsartan -hydrochlorothiazide  (DIOVAN  HCT) 160-12.5 MG tablet Take 1 tablet by mouth daily.   [DISCONTINUED] amLODipine  (NORVASC ) 2.5 MG tablet TAKE 1 TABLET(2.5 MG) BY MOUTH DAILY     Allergies:   Patient has no known allergies.   Past Medical History:  Diagnosis Date   Chronic headaches    GERD (gastroesophageal reflux disease)    History of kidney stones    x1 episode   Hypercholesteremia    Hypercholesterolemia    Lumbar radiculopathy    lower back pain- both legs has  numbness varies   Otitis media    Recurrent upper respiratory infection (URI)    Skin cancer of nose     Past Surgical History:  Procedure Laterality Date   BACK SURGERY     laminectomy-lumbar x3   COLONOSCOPY WITH PROPOFOL  N/A 11/28/2015   Procedure: COLONOSCOPY WITH PROPOFOL ;  Surgeon: Garrett Kallman, MD;  Location: [facility];  Service: Endoscopy;  Laterality: N/A;   INGUINAL HERNIA REPAIR Right 05/18/2013   Procedure: RIGHT INGUINAL HERNIA REPAIR ADULT;  Surgeon: Myrl Askew, MD;  Location: [facility];  Service: General;  Laterality: Right;  (no mesh)   SKIN CANCER EXCISION     nose   VASECTOMY       Social History:  The patient  reports that he quit smoking about 40 years ago. His smoking use included cigarettes. He started smoking about 45 years ago. He has a 5 pack-year smoking history. He has been exposed to tobacco smoke. He has never used smokeless tobacco. He reports that he does not drink alcohol and does not use drugs.   Family History:  The patient's  family history includes CAD in his mother.    ROS:  Please see the history of present illness. All other systems are reviewed and  Negative to the above problem except as noted.    PHYSICAL EXAM: VS:  BP 108/60   Pulse 71   Ht 5\' 10"  (1.778 m)   Wt 203 lb 6.4 oz (92.3 kg)   SpO2 98%   BMI 29.18 kg/m   GEN: Well nourished, well developed, in no acute distress  HEENT: normal  Neck: no JVD, carotid bruits Cardiac: RRR; no murmurs  No LE  edema  Respiratory:  clear to auscultation   GI: soft, nontender, nondistended  No hepatomegaly    EKG:  EKG is not done today  Myoview  AUg    CT coronary angiogram    Sept 2022   IMPRESSION: 1. Coronary calcium  score of 1807. This was 95th percentile for age and sex matched control.   2. Normal coronary origin with right dominance.   3. Nonobstructive CAD   4. Calcified plaque in the left main causes minimal (0-24%) stenosis   5. Mixed plaque in the proximal LAD causes mild (25-49%) stenosis, with high risk plaque features including positive remodeling, low attenuation plaque and napkin ring sign. There is calcified plaque in the mid LAD causing minimal (0-24%) stenosis. There is calcified plaque in ostial D1 causing mild (25-49%) stenosis.   6. Calcified plaque caused mild (25-49%) stenosis in the proximal and mid RCA, and causes minimal (0-24%) stenosis in the distal RCA.   7. Calcified plaque in the proximal LCX causes minimal (0-24%) stenosis   8. Dilated ascending aorta measuring 40mm   CAD-RADS 2. Mild non-obstructive CAD (25-49%). Consider non-atherosclerotic causes of chest pain. Consider preventive therapy and risk factor modification.   Echo October 2022   Left ventricular ejection fraction, by estimation, is 60 to 65%. The left ventricle has normal function. The left ventricle has no regional wall motion abnormalities. There is mild left ventricular hypertrophy. Left ventricular diastolic parameters were  normal. 1. Right ventricular systolic function is normal. The right ventricular size is normal. Tricuspid regurgitation signal is inadequate for assessing PA pressure. 2. 3. Left atrial size was mildly dilated. 4. Right atrial size was mildly dilated. The mitral valve is normal in structure. No evidence of mitral valve regurgitation. No evidence of mitral stenosis. 5. The aortic valve is  tricuspid. Aortic valve regurgitation is mild to moderate. No aortic stenosis is present. 6. 7. Aortic dilatation noted. There is dilatation of the aortic root, measuring 41 mm. The inferior vena cava is normal in size with greater than 50% respiratory variability, suggesting right atrial pressure of 3 mmHg.   Lipid Panel No results found for: "CHOL", "TRIG", "HDL", "CHOLHDL", "VLDL", "LDLCALC", "LDLDIRECT"    Wt Readings from Last 3 Encounters:  01/20/24 203 lb 6.4 oz (92.3 kg)  08/06/23 199 lb (90.3 kg)  07/11/23 201 lb 6.4 oz (91.4 kg)      ASSESSMENT AND PLAN:  1  CAD  CA score of 1800  Myoview  in Aug 2023 with no ischemia or infarct   Echo with LVEF normal     Pt denies CP   BUt he does give out easily   BP is on lower side today   I wonder if it is too low at times esp when activity  I recomm he hold amlodipine     Follow      If energy does not come back and if SOB continues I would recomm further evaluation with [facility]    Pt will notify via mychart in a few wks    2   HTN BP is much better than previous   Follow as make changes above  3  Dypsnea  Fatigue with yard work     4  Palpitations   Pt denies  palpitations    5  HL   Pt is on Lipitor and Zetia   LDL  56  HDL 48     6  A1C   6.2  Reviewed diet again   Cut out processed foods, carbs, SSB  Follow up in fall, sooner if problmes persist Current medicines are reviewed at length with the patient today.  The patient does not have concerns regarding medicines.  Signed, Ola Berger, MD  01/20/2024 9:48 PM    [facility]  [facility] [address] [place], [place], [place]  [address] Phone: [phone]; Fax: [phone]

## 2024-01-20 ENCOUNTER — Encounter: Payer: Self-pay | Admitting: Internal Medicine

## 2024-01-20 ENCOUNTER — Ambulatory Visit: Payer: Medicare Other | Attending: Internal Medicine | Admitting: Internal Medicine

## 2024-01-20 VITALS — BP 108/60 | HR 71 | Ht 70.0 in | Wt 203.4 lb

## 2024-01-20 DIAGNOSIS — I2510 Atherosclerotic heart disease of native coronary artery without angina pectoris: Secondary | ICD-10-CM | POA: Diagnosis not present

## 2024-01-20 NOTE — Patient Instructions (Addendum)
 Medication Instructions: Stop Amlodipine    *If you need a refill on your cardiac medications before your next appointment, please call your pharmacy*  Lab Work:  If you have labs (blood work) drawn today and your tests are completely normal, you will receive your results only by: MyChart Message (if you have MyChart) OR A paper copy in the mail If you have any lab test that is abnormal or we need to change your treatment, we will call you to review the results.  Testing/Procedures:   Follow-Up: Nov or Dec 2025 with Dr Avanell Bob   At [facility], you and your health needs are our priority.  As part of our continuing mission to provide you with exceptional heart care, our providers are all part of one team.  This team includes your primary Cardiologist (physician) and Advanced Practice Providers or APPs (Physician Assistants and Nurse Practitioners) who all work together to provide you with the care you need, when you need it.  Your next appointment:  We recommend signing up for the patient portal called "MyChart".  Sign up information is provided on this After Visit Summary.  MyChart is used to connect with patients for Virtual Visits (Telemedicine).  Patients are able to view lab/test results, encounter notes, upcoming appointments, etc.  Non-urgent messages can be sent to your provider as well.   To learn more about what you can do with MyChart, go to [email or web address].   Other Instructions       1st Floor: - Lobby - Registration  - Pharmacy  - Lab - Cafe  2nd Floor: - PV Lab - Diagnostic Testing (echo, CT, nuclear med)  3rd Floor: - Vacant  4th Floor: - TCTS (cardiothoracic surgery) - AFib Clinic - Structural Heart Clinic - Vascular Surgery  - Vascular Ultrasound  5th Floor: - HeartCare Cardiology (general and EP) - Clinical Pharmacy for coumadin, hypertension, lipid, weight-loss medications, and med management appointments    Valet parking services  will be available as well.

## 2024-01-21 NOTE — Progress Notes (Signed)
 [address] [place] [address] Dept: [phone]  FOLLOW UP NOTE  Patient ID: Jacob [name], male    DOB: 14-Jan-1951  Age: 73 y.o. MRN: [id number] Date of Office Visit: 01/22/2024  Assessment  Chief Complaint: Follow-up  HPI Jacob [name] is a 73 year old male who presents to the clinic for a follow-up visit.  He was last seen in this clinic on 07/12/2023 by Dr. Idolina Maker for evaluation of asthma, allergic rhinitis, and recurrent infection.    At today's visit, he reports occasional shortness of breath with activity, wheezing and cough producing mucus during pollen season.  He continues albuterol  1-2 times a week with relief of symptoms.  He is a former smoker.  His last x-ray imaging for chest pain and cough on 01/21/2023 indicates no acute abnormality. He reports feeling tired with activity recently and has visited his cardiologist on 01/20/2024.  At that visit, his blood pressure was noted to be low and he stopped taking amlodipine .  Continues to follow-up with [facility] with His Last Visit on 01/20/2024.  Allergic rhinitis is reported as moderately well-controlled with occasional symptoms including nasal congestion mainly occurring at night, occasional sneezing, and postnasal drainage with frequent throat clearing.  He continues Xyzal  5 mg once a day, azelastine  daily, and Flonase daily.  She is not currently using a nasal saline rinse.  His last environmental allergy  skin testing was on 07/12/2023 was positive to indoor mold, dust mite, and cat.  He is somewhat interested in allergen immunotherapy.    Allergic conjunctivitis is reported as well-controlled with only occasional itch for which she includes using an over-the-counter allergy  eyedrop.  He denies any recent infections requiring antibiotics or steroids.  His current medications are listed in the chart.  Chart review: CLINICAL DATA:  Dyspnea on exertion, LEFT side chest pain for 1 year,  former smoker, hypertension EXAM: CHEST - 2 VIEW  COMPARISON:  04/06/2021  FINDINGS: Normal heart size, mediastinal contours, and pulmonary vascularity.  Atherosclerotic calcification aorta. Lungs clear.  No pulmonary infiltrate, pleural effusion, or pneumothorax.  Osseous structures unremarkable.   IMPRESSION: No acute abnormalities. Aortic Atherosclerosis (ICD10-I70.0).   Electronically Signed   By: Wynelle Heather M.D.   On: 01/21/2023 14:41   Drug Allergies:  No Known Allergies  Physical Exam: BP 122/64   Pulse 65   Temp 98 F (36.7 C)   Resp 12   Wt 204 lb (92.5 kg)   SpO2 96%   BMI 29.27 kg/m    Physical Exam Vitals reviewed.  Constitutional:      Appearance: Normal appearance.  HENT:     Head: Normocephalic and atraumatic.     Right Ear: Tympanic membrane normal.     Left Ear: Tympanic membrane normal.     Nose:     Comments: Bilateral nares normal.  Pharynx erythematous with no exudate.  Ears normal.  Eyes normal. Eyes:     Conjunctiva/sclera: Conjunctivae normal.  Cardiovascular:     Rate and Rhythm: Normal rate and regular rhythm.     Heart sounds: Normal heart sounds. No murmur heard. Pulmonary:     Effort: Pulmonary effort is normal.     Breath sounds: Normal breath sounds.     Comments: Lungs clear to auscultation Musculoskeletal:        General: Normal range of motion.     Cervical back: Normal range of motion and neck supple.  Skin:    General: Skin is warm and dry.  Neurological:     Mental Status: He is alert and oriented to person, place, and time.  Psychiatric:        Mood and Affect: Mood normal.        Behavior: Behavior normal.        Thought Content: Thought content normal.        Judgment: Judgment normal.     Assessment and Plan: 1. Mild intermittent asthma, uncomplicated   2. Recurrent infections   3. Perennial allergic rhinitis     Patient Instructions  Asthma Continue albuterol  2 puffs once every 4 hours if needed for  cough or wheeze You may use albuterol  2 puffs 5-15 minutes before activity if needed  Allergic rhinitis Continue allergy  avoidance measures directed toward indoor mold, dust mite, and cat as listed below Continue Xyzal  5 mg once a day if needed for runny nose or itch Continue Flonase 2 sprays in each nostril once a day if needed for a stuffy nose.  In the right nostril, point the applicator out toward the right ear. In the left nostril, point the applicator out toward the left ear Continue azelastine  2 sprays in each nostril up to twice a day if needed for nasal symptoms Consider saline nasal rinses as needed for nasal symptoms. Use this before any medicated nasal sprays for best result Consider allergen immunotherapy if your symptoms are not well-controlled with treatment plan as listed above. Written information provided  Recurrent infection Keep track of infections, antibiotic use, and steroid use  Call the clinic if this treatment plan is not working well for you.  Follow up in 6 months or sooner if needed.   Return in about 6 months (around 07/23/2024), or if symptoms worsen or fail to improve.    Thank you for the opportunity to care for this patient.  Please do not hesitate to contact me with questions.  Marinus Sic, FNP [facility] 

## 2024-01-21 NOTE — Patient Instructions (Signed)
 Asthma Continue albuterol  2 puffs once every 4 hours if needed for cough or wheeze You may use albuterol  2 puffs 5-15 minutes before activity if needed  Allergic rhinitis Continue allergy  avoidance measures directed toward indoor mold, dust mite, and cat as listed below Continue Xyzal  5 mg once a day if needed for runny nose or itch Continue Flonase 2 sprays in each nostril once a day if needed for a stuffy nose.  In the right nostril, point the applicator out toward the right ear. In the left nostril, point the applicator out toward the left ear Continue azelastine  2 sprays in each nostril up to twice a day if needed for nasal symptoms Consider saline nasal rinses as needed for nasal symptoms. Use this before any medicated nasal sprays for best result Consider allergen immunotherapy if your symptoms are not well-controlled with treatment plan as listed above. Written information provided  Recurrent infection Keep track of infections, antibiotic use, and steroid use  Call the clinic if this treatment plan is not working well for you.  Follow up in 6 months or sooner if needed.  Control of Mold Allergen Mold and fungi can grow on a variety of surfaces provided certain temperature and moisture conditions exist.  Outdoor molds grow on plants, decaying vegetation and soil.  The major outdoor mold, Alternaria and Cladosporium, are found in very high numbers during hot and dry conditions.  Generally, a late Summer - Fall peak is seen for common outdoor fungal spores.  Rain will temporarily lower outdoor mold spore count, but counts rise rapidly when the rainy period ends.  The most important indoor molds are Aspergillus and Penicillium.  Dark, humid and poorly ventilated basements are ideal sites for mold growth.  The next most common sites of mold growth are the bathroom and the kitchen.  Outdoor [employer] Use air conditioning and keep windows closed Avoid exposure to decaying  vegetation. Avoid leaf raking. Avoid grain handling. Consider wearing a face mask if working in moldy areas.  Indoor Mold Control Maintain humidity below 50%. Clean washable surfaces with 5% bleach solution. Remove sources e.g. Contaminated carpets.   Control of Dust Mite Allergen Dust mites play a major role in allergic asthma and rhinitis. They occur in environments with high humidity wherever human skin is found. Dust mites absorb humidity from the atmosphere (ie, they do not drink) and feed on organic matter (including shed human and animal skin). Dust mites are a microscopic type of insect that you cannot see with the naked eye. High levels of dust mites have been detected from mattresses, pillows, carpets, upholstered furniture, bed covers, clothes, soft toys and any woven material. The principal allergen of the dust mite is found in its feces. A gram of dust may contain 1,000 mites and 250,000 fecal particles. Mite antigen is easily measured in the air during house cleaning activities. Dust mites do not bite and do not cause harm to humans, other than by triggering allergies/asthma.  Ways to decrease your exposure to dust mites in your home:  1. Encase mattresses, box springs and pillows with a mite-impermeable barrier or cover  2. Wash sheets, blankets and drapes weekly in hot water  (130 F) with detergent and dry them in a dryer on the hot setting.  3. Have the room cleaned frequently with a vacuum cleaner and a damp dust-mop. For carpeting or rugs, vacuuming with a vacuum cleaner equipped with a high-efficiency particulate air (HEPA) filter. The dust mite allergic individual should not  be in a room which is being cleaned and should wait 1 hour after cleaning before going into the room.  4. Do not sleep on upholstered furniture (eg, couches).  5. If possible removing carpeting, upholstered furniture and drapery from the home is ideal. Horizontal blinds should be eliminated in the  rooms where the person spends the most time (bedroom, study, television room). Washable vinyl, roller-type shades are optimal.  6. Remove all non-washable stuffed toys from the bedroom. Wash stuffed toys weekly like sheets and blankets above.  7. Reduce indoor humidity to less than 50%. Inexpensive humidity monitors can be purchased at most hardware stores. Do not use a humidifier as can make the problem worse and are not recommended.  Control of Dog or Cat Allergen Avoidance is the best way to manage a dog or cat allergy . If you have a dog or cat and are allergic to dog or cats, consider removing the dog or cat from the home. If you have a dog or cat but don't want to find it a new home, or if your family wants a pet even though someone in the household is allergic, here are some strategies that may help keep symptoms at bay:  Keep the pet out of your bedroom and restrict it to only a few rooms. Be advised that keeping the dog or cat in only one room will not limit the allergens to that room. Don't pet, hug or kiss the dog or cat; if you do, wash your hands with soap and water . High-efficiency particulate air (HEPA) cleaners run continuously in a bedroom or living room can reduce allergen levels over time. Regular use of a high-efficiency vacuum cleaner or a central vacuum can reduce allergen levels. Giving your dog or cat a bath at least once a week can reduce airborne allergen.

## 2024-01-22 ENCOUNTER — Other Ambulatory Visit: Payer: Self-pay

## 2024-01-22 ENCOUNTER — Ambulatory Visit: Payer: Medicare Other | Admitting: Family Medicine

## 2024-01-22 ENCOUNTER — Encounter: Payer: Self-pay | Admitting: Family Medicine

## 2024-01-22 VITALS — BP 122/64 | HR 65 | Temp 98.0°F | Resp 12 | Wt 204.0 lb

## 2024-01-22 DIAGNOSIS — J4520 Mild intermittent asthma, uncomplicated: Secondary | ICD-10-CM | POA: Diagnosis not present

## 2024-01-22 DIAGNOSIS — J3089 Other allergic rhinitis: Secondary | ICD-10-CM | POA: Diagnosis not present

## 2024-01-22 DIAGNOSIS — B999 Unspecified infectious disease: Secondary | ICD-10-CM | POA: Insufficient documentation

## 2024-02-07 DIAGNOSIS — G894 Chronic pain syndrome: Secondary | ICD-10-CM | POA: Diagnosis not present

## 2024-02-10 ENCOUNTER — Other Ambulatory Visit: Payer: Self-pay | Admitting: *Deleted

## 2024-02-10 MED ORDER — EZETIMIBE 10 MG PO TABS: 10.0000 mg | ORAL_TABLET | Freq: Every day | ORAL | 3 refills | Status: AC

## 2024-02-17 ENCOUNTER — Other Ambulatory Visit: Payer: Self-pay | Admitting: Internal Medicine

## 2024-03-17 DIAGNOSIS — G894 Chronic pain syndrome: Secondary | ICD-10-CM | POA: Diagnosis not present

## 2024-03-17 DIAGNOSIS — M545 Low back pain, unspecified: Secondary | ICD-10-CM | POA: Diagnosis not present

## 2024-03-30 DIAGNOSIS — I25119 Atherosclerotic heart disease of native coronary artery with unspecified angina pectoris: Secondary | ICD-10-CM | POA: Diagnosis not present

## 2024-03-30 DIAGNOSIS — E7800 Pure hypercholesterolemia, unspecified: Secondary | ICD-10-CM | POA: Diagnosis not present

## 2024-03-30 DIAGNOSIS — I10 Essential (primary) hypertension: Secondary | ICD-10-CM | POA: Diagnosis not present

## 2024-03-30 DIAGNOSIS — J45909 Unspecified asthma, uncomplicated: Secondary | ICD-10-CM | POA: Diagnosis not present

## 2024-04-06 ENCOUNTER — Telehealth: Payer: Self-pay | Admitting: Internal Medicine

## 2024-04-06 DIAGNOSIS — Z79899 Other long term (current) drug therapy: Secondary | ICD-10-CM

## 2024-04-06 DIAGNOSIS — R0609 Other forms of dyspnea: Secondary | ICD-10-CM

## 2024-04-06 DIAGNOSIS — I10 Essential (primary) hypertension: Secondary | ICD-10-CM

## 2024-04-06 NOTE — Telephone Encounter (Signed)
 Patient says he has been fatigued/exhausted on and off with minimal exertion. No issues with breathing.

## 2024-04-06 NOTE — Telephone Encounter (Signed)
 Spoke with pt regarding his fatigue. Pt stated he spoke with Dr. Okey at his last appointment about feeling fatigued and exhausted without a reason. Pt stated that Dr. Okey had asked for an update on his fatigue. Pt stated he has some shortness of breath but only with exertion. Pt stated he has no other symptoms. Pt stated his fatigue has not improved at all since seeing Dr. Okey last. Pt was told that Dr. Okey would be sent the update. Pt verbalized understanding. All questions if any were answered.

## 2024-04-14 DIAGNOSIS — G894 Chronic pain syndrome: Secondary | ICD-10-CM | POA: Diagnosis not present

## 2024-04-14 DIAGNOSIS — M545 Low back pain, unspecified: Secondary | ICD-10-CM | POA: Diagnosis not present

## 2024-04-14 DIAGNOSIS — M791 Myalgia, unspecified site: Secondary | ICD-10-CM | POA: Diagnosis not present

## 2024-04-14 DIAGNOSIS — M47816 Spondylosis without myelopathy or radiculopathy, lumbar region: Secondary | ICD-10-CM | POA: Diagnosis not present

## 2024-04-14 NOTE — Telephone Encounter (Signed)
 Called pt    He denies CP but is still having fatigue, SOB  Gives out with activity which he did not do 6 months ago Recomm  CBC, BMET , TSH  If all OK and nothing to explain by labs then I would recomm R/L heart cath to define pressures and anatomy Risks / benefits described   Pt understands and agrees to schedule pending labs studies

## 2024-04-14 NOTE — Telephone Encounter (Signed)
 Spoke with pt and he will come get labs done at [facility] on Friday.

## 2024-04-17 ENCOUNTER — Ambulatory Visit: Payer: Self-pay | Admitting: *Deleted

## 2024-04-17 ENCOUNTER — Other Ambulatory Visit (HOSPITAL_COMMUNITY)
Admission: RE | Admit: 2024-04-17 | Discharge: 2024-04-17 | Disposition: A | Discharge status: Home / Self Care | Source: Ambulatory Visit | Attending: Internal Medicine | Admitting: Internal Medicine

## 2024-04-17 DIAGNOSIS — R0609 Other forms of dyspnea: Secondary | ICD-10-CM | POA: Diagnosis not present

## 2024-04-17 DIAGNOSIS — I10 Essential (primary) hypertension: Secondary | ICD-10-CM | POA: Insufficient documentation

## 2024-04-17 DIAGNOSIS — Z79899 Other long term (current) drug therapy: Secondary | ICD-10-CM | POA: Insufficient documentation

## 2024-04-17 DIAGNOSIS — D649 Anemia, unspecified: Secondary | ICD-10-CM

## 2024-04-17 LAB — BASIC METABOLIC PANEL WITH GFR
Anion gap: 11 (ref 5–15)
BUN: 20 mg/dL (ref 8–23)
CO2: 24 mmol/L (ref 22–32)
Calcium: 8.9 mg/dL (ref 8.9–10.3)
Chloride: 101 mmol/L (ref 98–111)
Creatinine, Ser: 1.05 mg/dL (ref 0.61–1.24)
GFR, Estimated: >60 mL/min (ref >60)
Glucose, Bld: 114 mg/dL — ABNORMAL HIGH (ref 70–99)
Potassium: 4 mmol/L (ref 3.5–5.1)
Sodium: 136 mmol/L (ref 135–145)

## 2024-04-17 LAB — CBC
HCT: 35.6 % — ABNORMAL LOW (ref 39.0–52.0)
Hemoglobin: 11.9 g/dL — ABNORMAL LOW (ref 13.0–17.0)
MCH: 32 pg (ref 26.0–34.0)
MCHC: 33.4 g/dL (ref 30.0–36.0)
MCV: 95.7 fL (ref 80.0–100.0)
Platelets: 222 K/uL (ref 150–400)
RBC: 3.72 MIL/uL — ABNORMAL LOW (ref 4.22–5.81)
RDW: 12.8 % (ref 11.5–15.5)
WBC: 5.1 K/uL (ref 4.0–10.5)
nRBC: 0 % (ref 0.0–0.2)

## 2024-04-17 LAB — TSH: TSH: 1.003 u[IU]/mL (ref 0.350–4.500)

## 2024-04-19 ENCOUNTER — Other Ambulatory Visit: Payer: Self-pay | Admitting: Internal Medicine

## 2024-04-30 DIAGNOSIS — E7800 Pure hypercholesterolemia, unspecified: Secondary | ICD-10-CM | POA: Diagnosis not present

## 2024-04-30 DIAGNOSIS — I10 Essential (primary) hypertension: Secondary | ICD-10-CM | POA: Diagnosis not present

## 2024-04-30 DIAGNOSIS — J45909 Unspecified asthma, uncomplicated: Secondary | ICD-10-CM | POA: Diagnosis not present

## 2024-04-30 DIAGNOSIS — I25119 Atherosclerotic heart disease of native coronary artery with unspecified angina pectoris: Secondary | ICD-10-CM | POA: Diagnosis not present

## 2024-05-09 ENCOUNTER — Other Ambulatory Visit: Payer: Self-pay | Admitting: Internal Medicine

## 2024-05-12 DIAGNOSIS — M545 Low back pain, unspecified: Secondary | ICD-10-CM | POA: Diagnosis not present

## 2024-05-12 DIAGNOSIS — G894 Chronic pain syndrome: Secondary | ICD-10-CM | POA: Diagnosis not present

## 2024-05-12 DIAGNOSIS — M47816 Spondylosis without myelopathy or radiculopathy, lumbar region: Secondary | ICD-10-CM | POA: Diagnosis not present

## 2024-05-25 DIAGNOSIS — E7800 Pure hypercholesterolemia, unspecified: Secondary | ICD-10-CM | POA: Diagnosis not present

## 2024-05-25 DIAGNOSIS — G8929 Other chronic pain: Secondary | ICD-10-CM | POA: Diagnosis not present

## 2024-05-25 DIAGNOSIS — Z0000 Encounter for general adult medical examination without abnormal findings: Secondary | ICD-10-CM | POA: Diagnosis not present

## 2024-05-25 DIAGNOSIS — N182 Chronic kidney disease, stage 2 (mild): Secondary | ICD-10-CM | POA: Diagnosis not present

## 2024-05-25 DIAGNOSIS — K219 Gastro-esophageal reflux disease without esophagitis: Secondary | ICD-10-CM | POA: Diagnosis not present

## 2024-05-25 DIAGNOSIS — R5383 Other fatigue: Secondary | ICD-10-CM | POA: Diagnosis not present

## 2024-05-25 DIAGNOSIS — G43009 Migraine without aura, not intractable, without status migrainosus: Secondary | ICD-10-CM | POA: Diagnosis not present

## 2024-05-25 DIAGNOSIS — R7303 Prediabetes: Secondary | ICD-10-CM | POA: Diagnosis not present

## 2024-05-25 DIAGNOSIS — I25119 Atherosclerotic heart disease of native coronary artery with unspecified angina pectoris: Secondary | ICD-10-CM | POA: Diagnosis not present

## 2024-05-25 DIAGNOSIS — I10 Essential (primary) hypertension: Secondary | ICD-10-CM | POA: Diagnosis not present

## 2024-05-25 DIAGNOSIS — I77810 Thoracic aortic ectasia: Secondary | ICD-10-CM | POA: Diagnosis not present

## 2024-05-31 DIAGNOSIS — E7800 Pure hypercholesterolemia, unspecified: Secondary | ICD-10-CM | POA: Diagnosis not present

## 2024-05-31 DIAGNOSIS — I25119 Atherosclerotic heart disease of native coronary artery with unspecified angina pectoris: Secondary | ICD-10-CM | POA: Diagnosis not present

## 2024-05-31 DIAGNOSIS — I10 Essential (primary) hypertension: Secondary | ICD-10-CM | POA: Diagnosis not present

## 2024-05-31 DIAGNOSIS — J45909 Unspecified asthma, uncomplicated: Secondary | ICD-10-CM | POA: Diagnosis not present

## 2024-06-09 DIAGNOSIS — M25562 Pain in left knee: Secondary | ICD-10-CM | POA: Diagnosis not present

## 2024-06-09 DIAGNOSIS — G894 Chronic pain syndrome: Secondary | ICD-10-CM | POA: Diagnosis not present

## 2024-06-23 ENCOUNTER — Ambulatory Visit
Admission: RE | Admit: 2024-06-23 | Discharge: 2024-06-23 | Disposition: A | Discharge status: Home / Self Care | Source: Ambulatory Visit | Attending: Physician Assistant | Admitting: Physician Assistant

## 2024-06-23 ENCOUNTER — Other Ambulatory Visit: Payer: Self-pay | Admitting: Physician Assistant

## 2024-06-23 DIAGNOSIS — M1712 Unilateral primary osteoarthritis, left knee: Secondary | ICD-10-CM | POA: Diagnosis not present

## 2024-06-23 DIAGNOSIS — M542 Cervicalgia: Secondary | ICD-10-CM | POA: Diagnosis not present

## 2024-06-23 DIAGNOSIS — M25562 Pain in left knee: Secondary | ICD-10-CM

## 2024-06-23 DIAGNOSIS — M25569 Pain in unspecified knee: Secondary | ICD-10-CM | POA: Diagnosis not present

## 2024-06-30 DIAGNOSIS — J45909 Unspecified asthma, uncomplicated: Secondary | ICD-10-CM | POA: Diagnosis not present

## 2024-06-30 DIAGNOSIS — I25119 Atherosclerotic heart disease of native coronary artery with unspecified angina pectoris: Secondary | ICD-10-CM | POA: Diagnosis not present

## 2024-06-30 DIAGNOSIS — E7800 Pure hypercholesterolemia, unspecified: Secondary | ICD-10-CM | POA: Diagnosis not present

## 2024-06-30 DIAGNOSIS — I10 Essential (primary) hypertension: Secondary | ICD-10-CM | POA: Diagnosis not present

## 2024-07-06 NOTE — Patient Instructions (Signed)
 Asthma Continue albuterol  2 puffs once every 4 hours if needed for cough or wheeze You may use albuterol  2 puffs 5-15 minutes before activity if needed  Allergic rhinitis Continue allergy  avoidance measures directed toward indoor mold, dust mite, and cat as listed below Continue Xyzal  5 mg once a day if needed for runny nose or itch Continue Flonase 2 sprays in each nostril once a day if needed for a stuffy nose.  In the right nostril, point the applicator out toward the right ear. In the left nostril, point the applicator out toward the left ear Continue azelastine  2 sprays in each nostril up to twice a day if needed for nasal symptoms Consider saline nasal rinses as needed for nasal symptoms. Use this before any medicated nasal sprays for best result Consider allergen immunotherapy if your symptoms are not well-controlled with treatment plan as listed above. Written information provided  Recurrent infection Keep track of infections, antibiotic use, and steroid use  Call the clinic if this treatment plan is not working well for you.  Follow up in 6 months or sooner if needed.  Control of Mold Allergen Mold and fungi can grow on a variety of surfaces provided certain temperature and moisture conditions exist.  Outdoor molds grow on plants, decaying vegetation and soil.  The major outdoor mold, Alternaria and Cladosporium, are found in very high numbers during hot and dry conditions.  Generally, a late Summer - Fall peak is seen for common outdoor fungal spores.  Rain will temporarily lower outdoor mold spore count, but counts rise rapidly when the rainy period ends.  The most important indoor molds are Aspergillus and Penicillium.  Dark, humid and poorly ventilated basements are ideal sites for mold growth.  The next most common sites of mold growth are the bathroom and the kitchen.  Outdoor [employer] Use air conditioning and keep windows closed Avoid exposure to decaying  vegetation. Avoid leaf raking. Avoid grain handling. Consider wearing a face mask if working in moldy areas.  Indoor Mold Control Maintain humidity below 50%. Clean washable surfaces with 5% bleach solution. Remove sources e.g. Contaminated carpets.   Control of Dust Mite Allergen Dust mites play a major role in allergic asthma and rhinitis. They occur in environments with high humidity wherever human skin is found. Dust mites absorb humidity from the atmosphere (ie, they do not drink) and feed on organic matter (including shed human and animal skin). Dust mites are a microscopic type of insect that you cannot see with the naked eye. High levels of dust mites have been detected from mattresses, pillows, carpets, upholstered furniture, bed covers, clothes, soft toys and any woven material. The principal allergen of the dust mite is found in its feces. A gram of dust may contain 1,000 mites and 250,000 fecal particles. Mite antigen is easily measured in the air during house cleaning activities. Dust mites do not bite and do not cause harm to humans, other than by triggering allergies/asthma.  Ways to decrease your exposure to dust mites in your home:  1. Encase mattresses, box springs and pillows with a mite-impermeable barrier or cover  2. Wash sheets, blankets and drapes weekly in hot water  (130 F) with detergent and dry them in a dryer on the hot setting.  3. Have the room cleaned frequently with a vacuum cleaner and a damp dust-mop. For carpeting or rugs, vacuuming with a vacuum cleaner equipped with a high-efficiency particulate air (HEPA) filter. The dust mite allergic individual should not  be in a room which is being cleaned and should wait 1 hour after cleaning before going into the room.  4. Do not sleep on upholstered furniture (eg, couches).  5. If possible removing carpeting, upholstered furniture and drapery from the home is ideal. Horizontal blinds should be eliminated in the  rooms where the person spends the most time (bedroom, study, television room). Washable vinyl, roller-type shades are optimal.  6. Remove all non-washable stuffed toys from the bedroom. Wash stuffed toys weekly like sheets and blankets above.  7. Reduce indoor humidity to less than 50%. Inexpensive humidity monitors can be purchased at most hardware stores. Do not use a humidifier as can make the problem worse and are not recommended.  Control of Dog or Cat Allergen Avoidance is the best way to manage a dog or cat allergy . If you have a dog or cat and are allergic to dog or cats, consider removing the dog or cat from the home. If you have a dog or cat but don't want to find it a new home, or if your family wants a pet even though someone in the household is allergic, here are some strategies that may help keep symptoms at bay:  Keep the pet out of your bedroom and restrict it to only a few rooms. Be advised that keeping the dog or cat in only one room will not limit the allergens to that room. Don't pet, hug or kiss the dog or cat; if you do, wash your hands with soap and water . High-efficiency particulate air (HEPA) cleaners run continuously in a bedroom or living room can reduce allergen levels over time. Regular use of a high-efficiency vacuum cleaner or a central vacuum can reduce allergen levels. Giving your dog or cat a bath at least once a week can reduce airborne allergen.

## 2024-07-06 NOTE — Progress Notes (Signed)
 [address] [place] [address] Dept: [phone]  FOLLOW UP NOTE  Patient ID: Jacob [name], male    DOB: Aug 28, 1951  Age: 73 y.o. MRN: [id number] Date of Office Visit: 07/08/2024  Assessment  Chief Complaint: Follow-up (Pt reports that his asthma has been controlled. Once a week used his rescue meds. ACT 20/Pt reports using his antihistamines daily.)  HPI Jacob [name] is a 73 year old male who presents to the clinic for follow-up visit.  He was last seen in this clinic on 01/22/2024 by Arlean Mutter, FNP, for evaluation of asthma, allergic rhinitis, and recurrent infection.  At today's visit, he reports his asthma has been moderately well-controlled with infrequent symptoms including shortness of breath and occasional cough producing mucus.  He reports that the cough usually generates the shortness of breath.  He continues albuterol  infrequently with relief of symptoms.  Allergic rhinitis is reported as well-controlled with no symptoms including rhinorrhea, nasal congestion, sneezing, or postnasal drainage.  He continues Xyzal  5 mg once a day and occasionally uses Flonase, azelastine , and nasal saline rinses with relief of symptoms. His last environmental allergy  skin testing was on 07/12/2023 was positive to indoor mold, dust mite, and cat.  He denies any infections requiring antibiotics or steroids since his last visit to this clinic.  He reports that he generally gets a sinus infection once or twice a year, however, has not had 1 over the last year.  His current medications are listed in the chart.  Drug Allergies:  No Known Allergies  Physical Exam: BP (!) 140/56 (BP Location: Left Arm, Patient Position: Sitting, Cuff Size: Normal)   Pulse 81   Temp 98.9 F (37.2 C) (Temporal)   Resp 18   Ht 5' 10 (1.778 m)   Wt 206 lb (93.4 kg)   SpO2 93%   BMI 29.56 kg/m    Physical Exam Vitals reviewed.  Constitutional:      Appearance: Normal appearance.  HENT:      Head: Normocephalic and atraumatic.     Right Ear: Tympanic membrane normal.     Left Ear: Tympanic membrane normal.     Nose:     Comments: Bilateral nares slightly erythematous with thin clear nasal drainage noted.  Pharynx normal.  Ears normal.  Eyes normal.    Mouth/Throat:     Pharynx: Oropharynx is clear.  Eyes:     Conjunctiva/sclera: Conjunctivae normal.  Cardiovascular:     Rate and Rhythm: Normal rate and regular rhythm.     Heart sounds: Normal heart sounds. No murmur heard. Pulmonary:     Effort: Pulmonary effort is normal.     Breath sounds: Normal breath sounds.     Comments: Lungs clear to auscultation Musculoskeletal:        General: Normal range of motion.     Cervical back: Normal range of motion and neck supple.  Skin:    General: Skin is warm and dry.  Neurological:     Mental Status: He is alert and oriented to person, place, and time.  Psychiatric:        Mood and Affect: Mood normal.        Behavior: Behavior normal.        Thought Content: Thought content normal.        Judgment: Judgment normal.     Assessment and Plan: 1. Mild intermittent asthma, uncomplicated   2. Recurrent infections   3. Perennial allergic rhinitis     Patient Instructions  Asthma Continue albuterol  2 puffs once every 4 hours if needed for cough or wheeze You may use albuterol  2 puffs 5-15 minutes before activity if needed  Allergic rhinitis Continue allergy  avoidance measures directed toward indoor mold, dust mite, and cat as listed below Continue Xyzal  5 mg once a day if needed for runny nose or itch Continue Flonase 2 sprays in each nostril once a day if needed for a stuffy nose.  In the right nostril, point the applicator out toward the right ear. In the left nostril, point the applicator out toward the left ear Continue azelastine  2 sprays in each nostril up to twice a day if needed for nasal symptoms Consider saline nasal rinses as needed for nasal symptoms. Use  this before any medicated nasal sprays for best result Consider allergen immunotherapy if your symptoms are not well-controlled with treatment plan as listed above. Written information provided  Recurrent infection Keep track of infections, antibiotic use, and steroid use  Call the clinic if this treatment plan is not working well for you.  Follow up in 6 months or sooner if needed.  Return in about 6 months (around 01/06/2025), or if symptoms worsen or fail to improve.    Thank you for the opportunity to care for this patient.  Please do not hesitate to contact me with questions.  Arlean Mutter, FNP [facility] 

## 2024-07-07 DIAGNOSIS — M25562 Pain in left knee: Secondary | ICD-10-CM | POA: Diagnosis not present

## 2024-07-07 DIAGNOSIS — G894 Chronic pain syndrome: Secondary | ICD-10-CM | POA: Diagnosis not present

## 2024-07-07 DIAGNOSIS — M1712 Unilateral primary osteoarthritis, left knee: Secondary | ICD-10-CM | POA: Diagnosis not present

## 2024-07-08 ENCOUNTER — Other Ambulatory Visit: Payer: Self-pay

## 2024-07-08 ENCOUNTER — Ambulatory Visit: Admitting: Family Medicine

## 2024-07-08 ENCOUNTER — Encounter: Payer: Self-pay | Admitting: Family Medicine

## 2024-07-08 VITALS — BP 140/56 | HR 81 | Temp 98.9°F | Resp 18 | Ht 70.0 in | Wt 206.0 lb

## 2024-07-08 DIAGNOSIS — J4520 Mild intermittent asthma, uncomplicated: Secondary | ICD-10-CM | POA: Diagnosis not present

## 2024-07-08 DIAGNOSIS — J3089 Other allergic rhinitis: Secondary | ICD-10-CM

## 2024-07-08 DIAGNOSIS — B999 Unspecified infectious disease: Secondary | ICD-10-CM

## 2024-07-16 ENCOUNTER — Other Ambulatory Visit: Payer: Self-pay | Admitting: Allergy & Immunology

## 2024-07-16 DIAGNOSIS — J3089 Other allergic rhinitis: Secondary | ICD-10-CM

## 2024-08-07 ENCOUNTER — Other Ambulatory Visit: Payer: Self-pay | Admitting: Internal Medicine

## 2024-08-18 ENCOUNTER — Ambulatory Visit: Attending: Internal Medicine | Admitting: Internal Medicine

## 2024-08-18 ENCOUNTER — Encounter: Payer: Self-pay | Admitting: Internal Medicine

## 2024-08-18 VITALS — BP 128/60 | HR 57 | Ht 70.0 in | Wt 206.8 lb

## 2024-08-18 DIAGNOSIS — I2510 Atherosclerotic heart disease of native coronary artery without angina pectoris: Secondary | ICD-10-CM

## 2024-08-18 MED ORDER — NITROGLYCERIN 0.4 MG SL SUBL: 0.4000 mg | SUBLINGUAL_TABLET | SUBLINGUAL | 11 refills | Status: AC | PRN

## 2024-08-18 MED ORDER — FUROSEMIDE 20 MG PO TABS: ORAL_TABLET | ORAL | 3 refills | Status: AC

## 2024-08-18 NOTE — Patient Instructions (Signed)
 Medication Instructions:  Lasix 20 mg take daily only if you have swelling NTG SL if needed for chest pain  Continue all other medications *If you need a refill on your cardiac medications before your next appointment, please call your pharmacy*  Lab Work: None ordered  Testing/Procedures: None ordered  Follow-Up: At [facility], you and your health needs are our priority.  As part of our continuing mission to provide you with exceptional heart care, our providers are all part of one team.  This team includes your primary Cardiologist (physician) and Advanced Practice Providers or APPs (Physician Assistants and Nurse Practitioners) who all work together to provide you with the care you need, when you need it.  Your next appointment:  Follow up in 05/2025   Call in April to schedule August appointment    Provider:  Dr.Ross   We recommend signing up for the patient portal called MyChart.  Sign up information is provided on this After Visit Summary.  MyChart is used to connect with patients for Virtual Visits (Telemedicine).  Patients are able to view lab/test results, encounter notes, upcoming appointments, etc.  Non-urgent messages can be sent to your provider as well.   To learn more about what you can do with MyChart, go to [email or web address].

## 2024-08-18 NOTE — Progress Notes (Signed)
 Cardiology Office Note   Date:  08/18/2024   ID:  Naithen, Rivenburg 1951-06-07, MRN [id number]  PCP:  Ransom Other, MD  Cardiologist:   Vina Gull, MD   Follow up of CAD and HTN and SOB      History of Present Illness: Jacob [name] is a 73 y.o. male with a history ofhistory of HL, CP and CAD    2019  Myoview  was normal    2022 CCTA showed Ca score of approx 1800   Mild to mod CAD on CT   Aug  2023  Myoview  normal            I saw the pt in APril 2025 At that visit I recomm he back down on amlodipine  as BP was low   Since then he says he has been doing pretty good  Active   Does have good days  and some days where he has no energy   Walks    Has occasional chest pains    INtermittent Notes some fluid gain  WIll take fluid pill and feels better   About 1x every 2 wks   Cut out sweet tea about 1 month ago  Does not eat much protein  Current Meds  Medication Sig   albuterol  (PROVENTIL  HFA;VENTOLIN  HFA) 108 (90 Base) MCG/ACT inhaler Inhale 1-2 puffs into the lungs every 6 (six) hours as needed for wheezing or shortness of breath.   aspirin  EC 81 MG tablet Take 1 tablet (81 mg total) by mouth daily. Swallow whole.   atorvastatin  (LIPITOR) 40 MG tablet Take 40 mg by mouth daily.   azelastine  (ASTELIN ) 0.1 % nasal spray Place 2 sprays into both nostrils 2 (two) times daily as needed for rhinitis. Use in each nostril as directed   diclofenac Sodium (VOLTAREN) 1 % GEL Apply topically as needed.   docusate sodium  (COLACE) 100 MG capsule Take 100 mg by mouth 2 (two) times daily as needed for mild constipation.   ezetimibe  (ZETIA ) 10 MG tablet Take 1 tablet (10 mg total) by mouth daily.   famotidine  (PEPCID ) 20 MG tablet TAKE 1 TABLET BY MOUTH AFTER SUPPER   fluticasone (FLONASE) 50 MCG/ACT nasal spray SHAKE LQ AND U 2 SPRAYS IEN QD   HYDROcodone-acetaminophen (NORCO) 10-325 MG tablet Take 1 tablet by mouth every 4 (four) hours as needed. pain   isosorbide  mononitrate (IMDUR ) 30 MG  24 hr tablet TAKE 1 TABLET(30 MG) BY MOUTH DAILY   levocetirizine (XYZAL ) 5 MG tablet TAKE 1 TABLET(5 MG) BY MOUTH EVERY EVENING   meloxicam (MOBIC) 15 MG tablet Take 15 mg by mouth as needed.    naloxone (NARCAN) nasal spray 4 mg/0.1 mL Place 1 spray into the nose as needed.   oxyCODONE  (OXYCONTIN ) 20 MG 12 hr tablet Take 20 mg by mouth every 12 (twelve) hours.   pantoprazole  (PROTONIX ) 40 MG tablet TAKE 1 TABLET(40 MG) BY MOUTH DAILY 30 TO 60 MINUTES BEFORE FIRST MEAL OF THE DAY   promethazine (PHENERGAN) 25 MG tablet Take 25-50 mg by mouth every 8 (eight) hours as needed. nausea   valsartan -hydrochlorothiazide  (DIOVAN -HCT) 160-12.5 MG tablet TAKE 1 TABLET BY MOUTH DAILY     Allergies:   Patient has no known allergies.   Past Medical History:  Diagnosis Date   Chronic headaches    GERD (gastroesophageal reflux disease)    History of kidney stones    x1 episode   Hypercholesteremia    Hypercholesterolemia  Lumbar radiculopathy    lower back pain- both legs has numbness varies   Otitis media    Recurrent upper respiratory infection (URI)    Skin cancer of nose     Past Surgical History:  Procedure Laterality Date   BACK SURGERY     laminectomy-lumbar x3   COLONOSCOPY WITH PROPOFOL  N/A 11/28/2015   Procedure: COLONOSCOPY WITH PROPOFOL ;  Surgeon: Gladis MARLA Louder, MD;  Location: [facility];  Service: Endoscopy;  Laterality: N/A;   INGUINAL HERNIA REPAIR Right 05/18/2013   Procedure: RIGHT INGUINAL HERNIA REPAIR ADULT;  Surgeon: Elsie GORMAN Holland, MD;  Location: [facility];  Service: General;  Laterality: Right;  (no mesh)   SKIN CANCER EXCISION     nose   VASECTOMY       Social History:  The patient  reports that he quit smoking about 41 years ago. His smoking use included cigarettes. He started smoking about 46 years ago. He has a 5 pack-year smoking history. He has been exposed to tobacco smoke. He has never used smokeless tobacco. He reports that he does not drink alcohol  and does not use drugs.   Family History:  The patient's family history includes CAD in his mother.    ROS:  Please see the history of present illness. All other systems are reviewed and  Negative to the above problem except as noted.    PHYSICAL EXAM: VS:  BP 128/60 (BP Location: Right Arm, Patient Position: Sitting, Cuff Size: Normal)   Pulse (!) 57   Ht 5' 10 (1.778 m)   Wt 206 lb 12.8 oz (93.8 kg)   SpO2 96%   BMI 29.67 kg/m   GEN: Well nourished, well developed, in no acute distress  HEENT: normal  Neck: no JVD, no carotid bruits Cardiac: RRR; no murmurs   Respiratory:  clear to auscultation   GI: soft, nontender, nondistended  No hepatomegaly  Ext  No LE edema   EKG:  EKG shows SB 57 bpm    Myoview  AUg    CT coronary angiogram    Sept 2022   IMPRESSION: 1. Coronary calcium  score of 1807. This was 95th percentile for age and sex matched control.   2. Normal coronary origin with right dominance.   3. Nonobstructive CAD   4. Calcified plaque in the left main causes minimal (0-24%) stenosis   5. Mixed plaque in the proximal LAD causes mild (25-49%) stenosis, with high risk plaque features including positive remodeling, low attenuation plaque and napkin ring sign. There is calcified plaque in the mid LAD causing minimal (0-24%) stenosis. There is calcified plaque in ostial D1 causing mild (25-49%) stenosis.   6. Calcified plaque caused mild (25-49%) stenosis in the proximal and mid RCA, and causes minimal (0-24%) stenosis in the distal RCA.   7. Calcified plaque in the proximal LCX causes minimal (0-24%) stenosis   8. Dilated ascending aorta measuring 40mm   CAD-RADS 2. Mild non-obstructive CAD (25-49%). Consider non-atherosclerotic causes of chest pain. Consider preventive therapy and risk factor modification.   Echo October 2022   Left ventricular ejection fraction, by estimation, is 60 to 65%. The left ventricle has normal function. The left ventricle  has no regional wall motion abnormalities. There is mild left ventricular hypertrophy. Left ventricular diastolic parameters were normal. 1. Right ventricular systolic function is normal. The right ventricular size is normal. Tricuspid regurgitation signal is inadequate for assessing PA pressure. 2. 3. Left atrial size was mildly dilated. 4. Right atrial size  was mildly dilated. The mitral valve is normal in structure. No evidence of mitral valve regurgitation. No evidence of mitral stenosis. 5. The aortic valve is tricuspid. Aortic valve regurgitation is mild to moderate. No aortic stenosis is present. 6. 7. Aortic dilatation noted. There is dilatation of the aortic root, measuring 41 mm. The inferior vena cava is normal in size with greater than 50% respiratory variability, suggesting right atrial pressure of 3 mmHg.   Lipid Panel No results found for: CHOL, TRIG, HDL, CHOLHDL, VLDL, LDLCALC, LDLDIRECT    Wt Readings from Last 3 Encounters:  08/18/24 206 lb 12.8 oz (93.8 kg)  07/08/24 206 lb (93.4 kg)  01/22/24 204 lb (92.5 kg)      ASSESSMENT AND PLAN:  1  CAD  CA score of 1800  Myoview  in Aug 2023 with no ischemia or infarct   Echo with LVEF normal    Overall he appears to be doing pretty good   I am not convinced on any significant anginal symptoms    Keep on same regimen   Will give Rx for NTG prn  ALso Rx for 20 mg lasix to take prn for fluid retention   Call if more than intermittent   Watch salt  2   HTN BP remains controlled   3  Dypsnea Stable   4  Palpitations   Pt denies   5  HL   LDL 57  HDL 48  Trig 122    Keep on atorvastatin  and Zetia    6  A1C   6.4 in Aug 2025   He just cut out sweet tea about 1 month ago  REviewed diet   Increase protein Cut back on carbs and processed foods   Follow up in 9 months  Current medicines are reviewed at length with the patient today.  The patient does not have concerns regarding medicines.  Signed, Vina Gull, MD

## 2025-02-03 ENCOUNTER — Ambulatory Visit: Admitting: Allergy & Immunology

## 2025-03-15 ENCOUNTER — Ambulatory Visit: Admitting: Family Medicine
